# Patient Record
Sex: Male | Born: 1960 | Race: White | Hispanic: No | Marital: Married | Smoking: Never smoker
Health system: Southern US, Community
[De-identification: ages and names within clinical notes are randomized; demographics above are authoritative.]

## PROBLEM LIST (undated history)

## (undated) ENCOUNTER — Emergency Department (HOSPITAL_COMMUNITY): Admission: EM | Payer: Medicare Other | Source: Home / Self Care

## (undated) DIAGNOSIS — W3400XA Accidental discharge from unspecified firearms or gun, initial encounter: Secondary | ICD-10-CM

## (undated) DIAGNOSIS — H9190 Unspecified hearing loss, unspecified ear: Secondary | ICD-10-CM

## (undated) DIAGNOSIS — F419 Anxiety disorder, unspecified: Secondary | ICD-10-CM

## (undated) DIAGNOSIS — E119 Type 2 diabetes mellitus without complications: Secondary | ICD-10-CM

## (undated) DIAGNOSIS — F32A Depression, unspecified: Secondary | ICD-10-CM

## (undated) DIAGNOSIS — IMO0002 Reserved for concepts with insufficient information to code with codable children: Secondary | ICD-10-CM

## (undated) DIAGNOSIS — K219 Gastro-esophageal reflux disease without esophagitis: Secondary | ICD-10-CM

## (undated) DIAGNOSIS — M549 Dorsalgia, unspecified: Secondary | ICD-10-CM

## (undated) DIAGNOSIS — R198 Other specified symptoms and signs involving the digestive system and abdomen: Secondary | ICD-10-CM

## (undated) DIAGNOSIS — Y249XXA Unspecified firearm discharge, undetermined intent, initial encounter: Secondary | ICD-10-CM

## (undated) DIAGNOSIS — S8990XA Unspecified injury of unspecified lower leg, initial encounter: Secondary | ICD-10-CM

## (undated) DIAGNOSIS — I10 Essential (primary) hypertension: Secondary | ICD-10-CM

## (undated) DIAGNOSIS — S149XXA Injury of unspecified nerves of neck, initial encounter: Secondary | ICD-10-CM

## (undated) DIAGNOSIS — F329 Major depressive disorder, single episode, unspecified: Secondary | ICD-10-CM

## (undated) HISTORY — PX: NECK SURGERY: SHX720

## (undated) HISTORY — DX: Anxiety disorder, unspecified: F41.9

## (undated) HISTORY — DX: Major depressive disorder, single episode, unspecified: F32.9

## (undated) HISTORY — PX: LEG SURGERY: SHX1003

## (undated) HISTORY — DX: Unspecified injury of unspecified lower leg, initial encounter: S89.90XA

## (undated) HISTORY — DX: Other specified symptoms and signs involving the digestive system and abdomen: R19.8

## (undated) HISTORY — PX: TONSILLECTOMY: SUR1361

## (undated) HISTORY — DX: Depression, unspecified: F32.A

## (undated) HISTORY — DX: Injury of unspecified nerves of neck, initial encounter: S14.9XXA

## (undated) HISTORY — DX: Reserved for concepts with insufficient information to code with codable children: IMO0002

---

## 2000-08-29 ENCOUNTER — Encounter: Payer: Self-pay | Admitting: Urology

## 2000-08-29 ENCOUNTER — Ambulatory Visit (HOSPITAL_COMMUNITY): Admission: RE | Admit: 2000-08-29 | Discharge: 2000-08-29 | Payer: Self-pay | Admitting: Urology

## 2003-03-13 ENCOUNTER — Ambulatory Visit (HOSPITAL_COMMUNITY): Admission: RE | Admit: 2003-03-13 | Discharge: 2003-03-13 | Payer: Self-pay | Admitting: Orthopaedic Surgery

## 2003-03-13 ENCOUNTER — Encounter: Payer: Self-pay | Admitting: Orthopaedic Surgery

## 2003-04-24 ENCOUNTER — Ambulatory Visit (HOSPITAL_COMMUNITY): Admission: RE | Admit: 2003-04-24 | Discharge: 2003-04-24 | Payer: Self-pay | Admitting: Urology

## 2003-08-18 ENCOUNTER — Ambulatory Visit (HOSPITAL_COMMUNITY): Admission: RE | Admit: 2003-08-18 | Discharge: 2003-08-18 | Payer: Self-pay | Admitting: Orthopaedic Surgery

## 2004-02-05 ENCOUNTER — Ambulatory Visit (HOSPITAL_COMMUNITY): Admission: RE | Admit: 2004-02-05 | Discharge: 2004-02-05 | Payer: Self-pay | Admitting: Urology

## 2005-06-09 ENCOUNTER — Ambulatory Visit (HOSPITAL_COMMUNITY): Admission: RE | Admit: 2005-06-09 | Discharge: 2005-06-09 | Payer: Self-pay | Admitting: Orthopaedic Surgery

## 2006-09-03 ENCOUNTER — Ambulatory Visit (HOSPITAL_COMMUNITY): Admission: RE | Admit: 2006-09-03 | Discharge: 2006-09-03 | Payer: Self-pay | Admitting: Internal Medicine

## 2007-02-13 ENCOUNTER — Emergency Department (HOSPITAL_COMMUNITY): Admission: EM | Admit: 2007-02-13 | Discharge: 2007-02-13 | Payer: Self-pay | Admitting: Emergency Medicine

## 2007-02-25 ENCOUNTER — Ambulatory Visit (HOSPITAL_COMMUNITY): Admission: RE | Admit: 2007-02-25 | Discharge: 2007-02-25 | Payer: Self-pay | Admitting: Orthopaedic Surgery

## 2007-05-01 ENCOUNTER — Ambulatory Visit (HOSPITAL_COMMUNITY): Admission: RE | Admit: 2007-05-01 | Discharge: 2007-05-01 | Payer: Self-pay | Admitting: Orthopaedic Surgery

## 2007-10-28 ENCOUNTER — Ambulatory Visit (HOSPITAL_COMMUNITY): Admission: RE | Admit: 2007-10-28 | Discharge: 2007-10-29 | Payer: Self-pay | Admitting: Neurosurgery

## 2009-03-10 ENCOUNTER — Ambulatory Visit (HOSPITAL_COMMUNITY): Payer: Self-pay | Admitting: Psychology

## 2009-04-20 ENCOUNTER — Ambulatory Visit (HOSPITAL_COMMUNITY): Payer: Self-pay | Admitting: Psychology

## 2009-05-04 ENCOUNTER — Ambulatory Visit (HOSPITAL_COMMUNITY): Payer: Self-pay | Admitting: Psychology

## 2009-06-21 ENCOUNTER — Ambulatory Visit (HOSPITAL_COMMUNITY): Payer: Self-pay | Admitting: Psychology

## 2009-07-30 ENCOUNTER — Ambulatory Visit (HOSPITAL_COMMUNITY): Payer: Self-pay | Admitting: Psychology

## 2010-07-26 ENCOUNTER — Other Ambulatory Visit (HOSPITAL_COMMUNITY): Payer: Self-pay | Admitting: Internal Medicine

## 2010-07-29 ENCOUNTER — Ambulatory Visit (HOSPITAL_COMMUNITY)
Admission: RE | Admit: 2010-07-29 | Discharge: 2010-07-29 | Disposition: A | Discharge status: Home / Self Care | Payer: 59 | Source: Ambulatory Visit | Attending: Internal Medicine | Admitting: Internal Medicine

## 2010-07-29 DIAGNOSIS — R319 Hematuria, unspecified: Secondary | ICD-10-CM | POA: Insufficient documentation

## 2010-07-29 DIAGNOSIS — R109 Unspecified abdominal pain: Secondary | ICD-10-CM | POA: Insufficient documentation

## 2010-07-29 DIAGNOSIS — N200 Calculus of kidney: Secondary | ICD-10-CM | POA: Insufficient documentation

## 2010-07-29 MED ORDER — IOHEXOL 300 MG/ML  SOLN
125.0000 mL | Freq: Once | INTRAMUSCULAR | Status: AC | PRN
  Administered 2010-07-29: 125 mL via INTRAVENOUS

## 2010-11-01 NOTE — Op Note (Signed)
Nathan Sanchez, Nathan Sanchez NO.:  [id number]   MEDICAL RECORD NO.:  [id number]          PATIENT TYPE:  OIB   LOCATION:  3528                         FACILITY:  [facility]   PHYSICIAN:  Hewitt Shorts, M.D.DATE OF BIRTH:  1960-10-07   DATE OF PROCEDURE:  10/28/2007  DATE OF DISCHARGE:                               OPERATIVE REPORT   PREOPERATIVE DIAGNOSES:  1. Cervical disk herniation.  2. Cervical spondylosis.  3. Cervical degenerative disease.  4. Cervical radiculopathy.   POSTOPERATIVE DIAGNOSES:  1. Cervical disk herniation.  2. Cervical spondylosis.  3. Cervical degenerative disease.  4. Cervical radiculopathy.   PROCEDURE:  C5-C6 anterior cervical decompression, arthrodesis with  allograft and Tether cervical plating.   SURGEON:  Hewitt Shorts, MD   ASSISTANT:  Nelia Shi. Webb Silversmith, RN and Payton Doughty, MD   ANESTHESIA:  General endotracheal.   INDICATIONS:  This is a 50 year old man who presented with neck pain  extending to the shoulder and arms bilaterally, found to have  significant degeneration with broad based disk herniation at the C5-C6  level, was admitted, taken to surgery for decompression arthrodesis.   PROCEDURE:  The patient was brought to the operating room and placed  under general endotracheal anesthesia.  The patient was placed in 10  pounds of Holter traction.  The neck was prepped with Betadine soap and  solution and draped in sterile fashion.  A horizontal incision was made  in the left side of the neck.  The line of the incision was infiltrated  with local anesthetic with epinephrine.  Incision was made and carried  down through the subcutaneous tissue with bipolar electrocautery.  Electrocautery was used to maintain hemostasis.  Dissection was carried  down to the platysma which was divided and then we dissected through the  avascular plane leaving the sternocleidomastoid, carotid artery, and  jugular vein laterally, and the  trachea and esophagus medially.  The  ventral aspect of the vertebral column was identified.  An x-ray was  taken and the C5-C6 intervertebral disk space identified.  Diskectomy  was begun with incision of the annulus and continued with microcurettes  and pituitary rongeurs.  The disk itself was degenerated.  The  microscope was draped and brought into the field to provide additional  navigation, illumination, and visualization, and the remainder of the  decompression was performed using microdissection and microsurgical  technique.  The cartilaginous endplates of the vertebrae were removed  using the microcurettes and X-Max drill and then posterior osteophytic  overgrowth was removed using the X-Max drill along with a 2-mm Kerrison  punch with a thin foot plate.  The posterior longitudinal ligament was  carefully removed and we were able to decompress and remove the disk  herniation.  We were able to decompress the spinal canal and thecal sac.  Then our attention was turned to the neural foramina, which was  similarly decompressed by removing the protruding degenerative disk  material.  In the end, good decompression was achieved and once the  decompression was completed, hemostasis was established with the use of  Gelfoam soaked in thrombin, and then we measured the height of the  intervertebral disk spaces and selected a 9-mm interbody implant.  It  was hydrated in saline solution and then positioned in the  intervertebral disk space and countersunk.  We then selected a 14-mm  Tether cervical plate.  It was positioned over the fusion construct.  It  was secured to the vertebra with a pair of 4 x 16 mm variable-angle  screws at each level.  Each drill hole had a prior hole placed and the  screws then placed in the alternating fashion.  Once all 4 screws were  in place, a final tightening was performed.  An x-ray was taken which  showed the plate, grafts, and screws of C5 in good  position.  Screws of  C6 were poorly visualized because of his shoulders, but under direct  visualization construct appeared good.  The wound was irrigated with  Bacitracin solution and checked, hemostasis was established and  confirmed and then we proceeded with the closure.  The platysma was  closed with interrupted inverted 2-0 undyed Vicryl sutures.  The  subcutaneous and subcuticular were closed with interrupted inverted 3-0  undyed Vicryl sutures.  The skin was approximated with Dermabond.  The  procedure was tolerated well.  The estimated blood loss was 50 mL.  Sponge counts were correct.  Following surgery, the patient was placed  in a soft cervical collar, reversed from the anesthetic, extubated, and  transferred to the recovery room for further care.      Hewitt Shorts, M.D.  Electronically Signed     RWN/MEDQ  D:  10/28/2007  T:  10/29/2007  Job:  [id number]

## 2010-11-04 NOTE — H&P (Signed)
NAME:  Nathan Sanchez, Nathan Sanchez                           ACCOUNT NO.:  [id number]   MEDICAL RECORD NO.:  [id number]                   PATIENT TYPE:  AMB   LOCATION:  DAY                                  FACILITY:  [facility]   PHYSICIAN:  J. Darreld Mclean, M.D.              DATE OF BIRTH:  [birth year]   DATE OF ADMISSION:  08/18/2003  DATE OF DISCHARGE:                                HISTORY & PHYSICAL   CHIEF COMPLAINT:  Carpal tunnel syndrome of right hand.   HISTORY OF PRESENT ILLNESS:  The patient is a 50 year old white male who is  to be admitted through day surgery on August 18, 2003 to undergo a carpal  tunnel release of his right hand.   He was initially seen here in our office on July 31, 2003, complaining  of numbness in both hands, right greater than left.  He has a history of  nocturnal numbness and tingling, pain and numbness particularly to the  median nerve distribution of both hands, again, right more so than the left  hand.   It was felt by Dr. Teola Bradley that he had carpal tunnel syndrome.  He  was set up to undergo nerve conduction velocities; these were done on  August 03, 2003 and they showed that he has severe carpal tunnel syndrome  of his right hand and moderate carpal tunnel syndrome of the left hand.   The patient returns today.  Results of the study were explained to him and  other conservative measures as well as the cockup splint he is now wearing  were recommended and the patient stated he wanted to go ahead and have this  surgery and it has been bothering him for several years and he cannot rest  because it.  Risks and imponderables were discussed with him such as  decreased strength to the hand following the procedure.  He understands and  wants to go ahead with the procedure.   PAST MEDICAL HISTORY:  The patient has been in excellent health; he stated,  however, he had some GI upset secondary to the Vicodin that was prescribed  per Dr. Hilda Lias before  for the carpal tunnel.  He denies diabetes mellitus,  stroke, cardiac or respiratory problems.   MEDICATIONS:  Darvocet-N 100 one tab q.4 h. p.r.n. for pain.   ALLERGIES:  Questionable allergy to VICODIN.   LOCAL MEDICAL DOCTOR:  Dr. Kingsley Callander. [place].   FAMILY AND SOCIAL HISTORY:  The patient is married.  He works as a Psychologist, occupational at  [employer] in [place].  He does not smoke, nor does he  drink alcohol.  He has a Holiday representative.   FAMILY HISTORY:  The patient states his parents are in good health; it is  unclear as to whether his grandparents are or not.   REVIEW OF SYSTEMS:  Review of systems is negative for this problem.  PHYSICAL EXAMINATION:  GENERAL:  The patient is 6-foot 2-inches tall and  weighs 205 pounds, alert and oriented x3.  VITAL SIGNS:  He is afebrile, pulse of 56, respirations 12, blood pressure  118/82.  HEENT:  Within normal limits.  NECK:  Neck is supple, no thyromegaly or masses palpated.  LUNGS:  Lungs are clear to A&P.  HEART:  Regular rhythm.  No cardiomegaly is noted.  ABDOMEN:  Abdomen is flat, soft and nontender.  No organomegaly or masses  palpated.  Hypoactive bowel sounds are auscultated.  EXTREMITIES:  Right hand and left hand:  Positive Tinel's and Phalen's  signs.  It appears that he has lost some thenar muscle on the right as  compared to the left.  He has full range of motion in this fingers and  wrists.  Neurovascular is intact to both hands.  Other extremities within  normal limits and neurovascularly intact to them.  SKIN:  Skin is intact.  CNS:  Intact.   IMPRESSION:  Carpal tunnel syndrome, right greater than left hand.   PLAN:  The patient is to be admitted through day hospital on August 18, 2003  to undergo carpal tunnel release of the right hand.     _____________________________________  ___________________________________________  Candace Cruise, P.ATeola Bradley, M.D.   BB/MEDQ  D:   08/07/2003  T:  08/07/2003  Job:  [id number]

## 2010-11-04 NOTE — Op Note (Signed)
NAME:  QUILL, GRINDER                           ACCOUNT NO.:  [id number]   MEDICAL RECORD NO.:  [id number]                   PATIENT TYPE:  AMB   LOCATION:  DAY                                  FACILITY:  [facility]   PHYSICIAN:  J. Darreld Mclean, M.D.              DATE OF BIRTH:  [birth year]   DATE OF PROCEDURE:  DATE OF DISCHARGE:                                 OPERATIVE REPORT   PREOPERATIVE DIAGNOSIS:  Carpal tunnel syndrome, right.   POSTOPERATIVE DIAGNOSIS:  Carpal tunnel syndrome, right.   PROCEDURE:  Release of volar carpal ligament, saline neurolysis  aponeurotomy, right median nerve.   ANESTHESIA:  Bier block.   SURGEON:  J. Darreld Mclean, M.D.   ASSISTANT:  Candace Cruise, P.A.-C.   DRAINS:  None.  Volar plaster splint applied.   TOURNIQUET TIME:  Please see anesthesia record for tourniquet time.   INDICATIONS FOR PROCEDURE:  The patient is a 50 year old male with pain and  tenderness consistent with carpal tunnel syndrome.  Nerve conduction  velocity study showed carpal tunnel syndrome, severe on the right, moderate  on the left.  The patient has not improved with conservative treatment.  Surgery is now indicated.  The risks and imponderables have been discussed  with the patient who appears to understand and agreed to the procedure as  outlined.  He asked appropriate questions.   DESCRIPTION OF PROCEDURE:  The patient was seen in the holding area, and his  right hand was marked by both the patient and myself.   The patient was brought to the operating room and Bier block anesthesia was  given.  He was prepped and draped in the usual manner.  We reconfirmed that  we were doing the right hand for carpal tunnel surgery.  The outline for the  incision was made on the volar side of the hand.  With careful dissection,  the median nerve was identified and a vessel loop placed around the nerve.  Then a groove director was placed within the carpal tunnel space, and the  volar  carpal ligament was then incised.  The nerve was obviously compressed.  The retinaculum was cut proximally.  Saline neurolysis aponeurotomy carried  out.  The specimen of the volar carpal ligament was sent to pathology.  It  should be noted that in the wound, there looked like there was some foreign  body, and we removed three small metallic fragments up deep under the skin.  They were exposed when we opened the hand.  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  The area was inspected,  and there was no apparent injury.  The wound was reapproximated using 3-0  nylon in interrupted vertical mattress manner.  A sterile dressing was  applied.  Sheet cotton applied.  Sheet cotton cut dorsally.  Volar plaster  splint applied.  Ace bandage applied  loosely.   The patient tolerated the procedure well and went to recovery in good  condition.  The patient was given a prescription for Darvocet-N 100 for  pain.  He will be seen in the office in approximately 10 days to 2 weeks.  For any difficulties, he is to contact me through the office or hospital  beeper system.      ___________________________________________                                            Teola Bradley, M.D.   JWK/MEDQ  D:  08/18/2003  T:  08/18/2003  Job:  [id number]

## 2010-11-25 ENCOUNTER — Encounter (HOSPITAL_COMMUNITY): Payer: 59 | Admitting: Psychology

## 2011-01-10 ENCOUNTER — Encounter (INDEPENDENT_AMBULATORY_CARE_PROVIDER_SITE_OTHER): Payer: 59 | Admitting: Psychology

## 2011-01-10 DIAGNOSIS — F0630 Mood disorder due to known physiological condition, unspecified: Secondary | ICD-10-CM

## 2011-01-10 DIAGNOSIS — F4310 Post-traumatic stress disorder, unspecified: Secondary | ICD-10-CM

## 2011-01-24 ENCOUNTER — Encounter (HOSPITAL_COMMUNITY): Payer: 59 | Admitting: Psychology

## 2011-01-26 ENCOUNTER — Ambulatory Visit (HOSPITAL_COMMUNITY): Payer: 59 | Admitting: Psychology

## 2011-05-17 ENCOUNTER — Encounter (HOSPITAL_COMMUNITY): Payer: Self-pay | Admitting: Psychology

## 2011-05-17 ENCOUNTER — Ambulatory Visit (INDEPENDENT_AMBULATORY_CARE_PROVIDER_SITE_OTHER): Payer: 59 | Admitting: Psychology

## 2011-05-17 DIAGNOSIS — F0631 Mood disorder due to known physiological condition with depressive features: Secondary | ICD-10-CM

## 2011-05-17 DIAGNOSIS — F0630 Mood disorder due to known physiological condition, unspecified: Secondary | ICD-10-CM

## 2011-05-17 DIAGNOSIS — F4310 Post-traumatic stress disorder, unspecified: Secondary | ICD-10-CM
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
## 2011-06-07 ENCOUNTER — Ambulatory Visit (INDEPENDENT_AMBULATORY_CARE_PROVIDER_SITE_OTHER): Payer: 59 | Admitting: Psychology

## 2011-06-07 DIAGNOSIS — F4310 Post-traumatic stress disorder, unspecified: Secondary | ICD-10-CM

## 2011-06-07 DIAGNOSIS — F0630 Mood disorder due to known physiological condition, unspecified: Secondary | ICD-10-CM

## 2011-06-07 DIAGNOSIS — F0631 Mood disorder due to known physiological condition with depressive features: Secondary | ICD-10-CM
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
## 2011-09-13 ENCOUNTER — Ambulatory Visit (HOSPITAL_COMMUNITY): Payer: 59 | Admitting: Psychology

## 2011-10-19 ENCOUNTER — Ambulatory Visit (HOSPITAL_COMMUNITY): Payer: Self-pay | Admitting: Psychology

## 2011-12-29 ENCOUNTER — Emergency Department (HOSPITAL_COMMUNITY)
Admission: EM | Admit: 2011-12-29 | Discharge: 2011-12-29 | Disposition: A | Discharge status: Home / Self Care | Payer: 59 | Attending: Emergency Medicine | Admitting: Emergency Medicine

## 2011-12-29 ENCOUNTER — Encounter (HOSPITAL_COMMUNITY): Payer: Self-pay | Admitting: *Deleted

## 2011-12-29 DIAGNOSIS — F341 Dysthymic disorder: Secondary | ICD-10-CM | POA: Insufficient documentation

## 2011-12-29 DIAGNOSIS — B9789 Other viral agents as the cause of diseases classified elsewhere: Secondary | ICD-10-CM | POA: Insufficient documentation

## 2011-12-29 DIAGNOSIS — W57XXXA Bitten or stung by nonvenomous insect and other nonvenomous arthropods, initial encounter: Secondary | ICD-10-CM

## 2011-12-29 DIAGNOSIS — R112 Nausea with vomiting, unspecified: Secondary | ICD-10-CM | POA: Insufficient documentation

## 2011-12-29 DIAGNOSIS — B349 Viral infection, unspecified: Secondary | ICD-10-CM

## 2011-12-29 HISTORY — DX: Unspecified hearing loss, unspecified ear: H91.90

## 2011-12-29 MED ORDER — MORPHINE SULFATE 2 MG/ML IJ SOLN
2.0000 mg | Freq: Once | INTRAMUSCULAR | Status: AC
  Administered 2011-12-29: 2 mg via INTRAVENOUS

## 2011-12-29 MED ORDER — DOXYCYCLINE HYCLATE 100 MG IV SOLR
100.0000 mg | Freq: Once | INTRAVENOUS | Status: DC
  Filled 2011-12-29: qty 100

## 2011-12-29 MED ORDER — DOXYCYCLINE HYCLATE 100 MG PO TABS
ORAL_TABLET | ORAL | Status: AC
  Filled 2011-12-29: qty 1

## 2011-12-29 MED ORDER — MORPHINE SULFATE 4 MG/ML IJ SOLN
INTRAMUSCULAR | Status: AC
  Filled 2011-12-29: qty 1

## 2011-12-29 MED ORDER — DOXYCYCLINE HYCLATE 100 MG PO CAPS: 100.0000 mg | ORAL_CAPSULE | Freq: Two times a day (BID) | ORAL | Status: AC

## 2011-12-29 MED ORDER — ONDANSETRON HCL 4 MG/2ML IJ SOLN
4.0000 mg | Freq: Once | INTRAMUSCULAR | Status: AC
  Administered 2011-12-29: 4 mg via INTRAVENOUS
  Filled 2011-12-29: qty 2

## 2011-12-29 MED ORDER — KETOROLAC TROMETHAMINE 30 MG/ML IJ SOLN
30.0000 mg | Freq: Once | INTRAMUSCULAR | Status: AC
  Administered 2011-12-29: 30 mg via INTRAVENOUS
  Filled 2011-12-29: qty 1

## 2011-12-29 MED ORDER — SODIUM CHLORIDE 0.9 % IV BOLUS (SEPSIS)
1000.0000 mL | Freq: Once | INTRAVENOUS | Status: AC
  Administered 2011-12-29: 1000 mL via INTRAVENOUS

## 2011-12-29 MED ORDER — DOXYCYCLINE HYCLATE 100 MG PO TABS
100.0000 mg | ORAL_TABLET | Freq: Once | ORAL | Status: AC
  Administered 2011-12-29: 100 mg via ORAL

## 2011-12-29 MED ORDER — ONDANSETRON HCL 4 MG PO TABS: 4.0000 mg | ORAL_TABLET | Freq: Four times a day (QID) | ORAL | Status: AC

## 2011-12-29 NOTE — ED Notes (Signed)
Pt reports nausea & vomiting x1 day, now having abdominal pain & body aches. Pulled a tick off of his back a week ago.

## 2011-12-29 NOTE — ED Notes (Signed)
AC called for medication 

## 2011-12-29 NOTE — ED Notes (Signed)
Pt alert & oriented x4, stable gait. Patient given discharge instructions, paperwork & prescription(s). Patient  instructed to stop at the registration desk to finish any additional paperwork. Patient verbalized understanding. Pt left department w/ no further questions. 
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
## 2012-01-31 ENCOUNTER — Encounter (HOSPITAL_COMMUNITY): Payer: Self-pay | Admitting: Psychology

## 2012-01-31 ENCOUNTER — Ambulatory Visit (INDEPENDENT_AMBULATORY_CARE_PROVIDER_SITE_OTHER): Payer: 59 | Admitting: Psychology

## 2012-01-31 DIAGNOSIS — F0631 Mood disorder due to known physiological condition with depressive features: Secondary | ICD-10-CM

## 2012-01-31 DIAGNOSIS — F39 Unspecified mood [affective] disorder: Secondary | ICD-10-CM

## 2012-01-31 DIAGNOSIS — F4310 Post-traumatic stress disorder, unspecified: Secondary | ICD-10-CM
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
## 2012-03-01 ENCOUNTER — Ambulatory Visit (HOSPITAL_COMMUNITY): Payer: Self-pay | Admitting: Psychology

## 2012-03-05 ENCOUNTER — Ambulatory Visit (HOSPITAL_COMMUNITY): Payer: Self-pay | Admitting: Psychology

## 2012-03-07 ENCOUNTER — Ambulatory Visit (HOSPITAL_COMMUNITY): Payer: Self-pay | Admitting: Psychology

## 2012-05-12 ENCOUNTER — Emergency Department (HOSPITAL_COMMUNITY): Payer: 59

## 2012-05-12 ENCOUNTER — Encounter (HOSPITAL_COMMUNITY): Payer: Self-pay | Admitting: Emergency Medicine

## 2012-05-12 ENCOUNTER — Emergency Department (HOSPITAL_COMMUNITY)
Admission: EM | Admit: 2012-05-12 | Discharge: 2012-05-12 | Disposition: A | Discharge status: Home / Self Care | Payer: 59 | Attending: Emergency Medicine | Admitting: Emergency Medicine

## 2012-05-12 DIAGNOSIS — F329 Major depressive disorder, single episode, unspecified: Secondary | ICD-10-CM | POA: Insufficient documentation

## 2012-05-12 DIAGNOSIS — Z87828 Personal history of other (healed) physical injury and trauma: Secondary | ICD-10-CM | POA: Insufficient documentation

## 2012-05-12 DIAGNOSIS — H209 Unspecified iridocyclitis: Secondary | ICD-10-CM | POA: Insufficient documentation

## 2012-05-12 DIAGNOSIS — H53149 Visual discomfort, unspecified: Secondary | ICD-10-CM | POA: Insufficient documentation

## 2012-05-12 DIAGNOSIS — F411 Generalized anxiety disorder: Secondary | ICD-10-CM | POA: Insufficient documentation

## 2012-05-12 DIAGNOSIS — F3289 Other specified depressive episodes: Secondary | ICD-10-CM | POA: Insufficient documentation

## 2012-05-12 DIAGNOSIS — H5789 Other specified disorders of eye and adnexa: Secondary | ICD-10-CM | POA: Insufficient documentation

## 2012-05-12 MED ORDER — TETRACAINE HCL 0.5 % OP SOLN
OPHTHALMIC | Status: AC
  Administered 2012-05-12: 03:00:00
  Filled 2012-05-12: qty 2

## 2012-05-12 MED ORDER — HYDROCODONE-ACETAMINOPHEN 5-325 MG PO TABS: 1.0000 | ORAL_TABLET | ORAL | Status: DC | PRN

## 2012-05-12 MED ORDER — FLUORESCEIN SODIUM 1 MG OP STRP
ORAL_STRIP | OPHTHALMIC | Status: AC
  Administered 2012-05-12: 03:00:00
  Filled 2012-05-12: qty 1

## 2012-05-12 MED ORDER — ERYTHROMYCIN 5 MG/GM OP OINT: TOPICAL_OINTMENT | OPHTHALMIC | Status: DC

## 2012-05-12 NOTE — ED Notes (Signed)
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

## 2012-05-12 NOTE — ED Notes (Signed)
Patient transported to CT 
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
## 2012-07-20 ENCOUNTER — Emergency Department (HOSPITAL_COMMUNITY)
Admission: EM | Admit: 2012-07-20 | Discharge: 2012-07-20 | Disposition: A | Discharge status: Home / Self Care | Payer: 59 | Attending: Emergency Medicine | Admitting: Emergency Medicine

## 2012-07-20 ENCOUNTER — Emergency Department (HOSPITAL_COMMUNITY): Payer: 59

## 2012-07-20 ENCOUNTER — Encounter (HOSPITAL_COMMUNITY): Payer: Self-pay | Admitting: *Deleted

## 2012-07-20 DIAGNOSIS — Z79899 Other long term (current) drug therapy: Secondary | ICD-10-CM | POA: Insufficient documentation

## 2012-07-20 DIAGNOSIS — Z8719 Personal history of other diseases of the digestive system: Secondary | ICD-10-CM | POA: Insufficient documentation

## 2012-07-20 DIAGNOSIS — X503XXA Overexertion from repetitive movements, initial encounter: Secondary | ICD-10-CM | POA: Insufficient documentation

## 2012-07-20 DIAGNOSIS — R6889 Other general symptoms and signs: Secondary | ICD-10-CM | POA: Insufficient documentation

## 2012-07-20 DIAGNOSIS — Z8659 Personal history of other mental and behavioral disorders: Secondary | ICD-10-CM | POA: Insufficient documentation

## 2012-07-20 DIAGNOSIS — Y939 Activity, unspecified: Secondary | ICD-10-CM | POA: Insufficient documentation

## 2012-07-20 DIAGNOSIS — S298XXA Other specified injuries of thorax, initial encounter: Secondary | ICD-10-CM | POA: Insufficient documentation

## 2012-07-20 DIAGNOSIS — R0789 Other chest pain: Secondary | ICD-10-CM

## 2012-07-20 DIAGNOSIS — Y929 Unspecified place or not applicable: Secondary | ICD-10-CM | POA: Insufficient documentation

## 2012-07-20 DIAGNOSIS — Z87828 Personal history of other (healed) physical injury and trauma: Secondary | ICD-10-CM | POA: Insufficient documentation

## 2012-07-20 MED ORDER — HYDROCODONE-ACETAMINOPHEN 5-325 MG PO TABS: 1.0000 | ORAL_TABLET | ORAL | Status: DC | PRN

## 2012-07-20 MED ORDER — IBUPROFEN 800 MG PO TABS: 800.0000 mg | ORAL_TABLET | Freq: Three times a day (TID) | ORAL | Status: DC

## 2012-07-20 MED ORDER — HYDROCODONE-ACETAMINOPHEN 5-325 MG PO TABS
2.0000 | ORAL_TABLET | Freq: Once | ORAL | Status: AC
  Administered 2012-07-20: 2 via ORAL
  Filled 2012-07-20: qty 2

## 2012-07-20 MED ORDER — ONDANSETRON HCL 4 MG PO TABS
4.0000 mg | ORAL_TABLET | Freq: Once | ORAL | Status: AC
  Administered 2012-07-20: 4 mg via ORAL
  Filled 2012-07-20: qty 1

## 2012-07-20 MED ORDER — IBUPROFEN 800 MG PO TABS
800.0000 mg | ORAL_TABLET | Freq: Once | ORAL | Status: AC
  Administered 2012-07-20: 800 mg via ORAL
  Filled 2012-07-20: qty 1

## 2012-07-20 NOTE — ED Notes (Signed)
Pt states he sneezed and felt something pop in his right rib cage area.

## 2012-07-20 NOTE — ED Notes (Signed)
Pt states he sneezed today and felt a pop in his right rib area, states pain with a deep breath. Denies having a cough or any difficulty breathing, lungs sound equal, clear at this time.
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
## 2012-07-23 NOTE — ED Provider Notes (Signed)
Medical screening examination/treatment/procedure(s) were performed by non-physician practitioner and as supervising physician I was immediately available for consultation/collaboration.   Shelda Jakes, MD 07/23/12 [phone]

## 2012-09-16 ENCOUNTER — Emergency Department (HOSPITAL_COMMUNITY): Payer: 59

## 2012-09-16 ENCOUNTER — Emergency Department (HOSPITAL_COMMUNITY)
Admission: EM | Admit: 2012-09-16 | Discharge: 2012-09-16 | Disposition: A | Discharge status: Home / Self Care | Payer: 59 | Attending: Emergency Medicine | Admitting: Emergency Medicine

## 2012-09-16 ENCOUNTER — Encounter (HOSPITAL_COMMUNITY): Payer: Self-pay | Admitting: *Deleted

## 2012-09-16 DIAGNOSIS — Y9301 Activity, walking, marching and hiking: Secondary | ICD-10-CM | POA: Insufficient documentation

## 2012-09-16 DIAGNOSIS — Z87828 Personal history of other (healed) physical injury and trauma: Secondary | ICD-10-CM | POA: Insufficient documentation

## 2012-09-16 DIAGNOSIS — S90414A Abrasion, right lesser toe(s), initial encounter: Secondary | ICD-10-CM

## 2012-09-16 DIAGNOSIS — Z8719 Personal history of other diseases of the digestive system: Secondary | ICD-10-CM | POA: Insufficient documentation

## 2012-09-16 DIAGNOSIS — K029 Dental caries, unspecified: Secondary | ICD-10-CM | POA: Insufficient documentation

## 2012-09-16 DIAGNOSIS — Y929 Unspecified place or not applicable: Secondary | ICD-10-CM | POA: Insufficient documentation

## 2012-09-16 DIAGNOSIS — S9030XA Contusion of unspecified foot, initial encounter: Secondary | ICD-10-CM | POA: Insufficient documentation

## 2012-09-16 DIAGNOSIS — S9031XA Contusion of right foot, initial encounter: Secondary | ICD-10-CM

## 2012-09-16 DIAGNOSIS — IMO0002 Reserved for concepts with insufficient information to code with codable children: Secondary | ICD-10-CM | POA: Insufficient documentation

## 2012-09-16 DIAGNOSIS — Z8659 Personal history of other mental and behavioral disorders: Secondary | ICD-10-CM | POA: Insufficient documentation

## 2012-09-16 DIAGNOSIS — R269 Unspecified abnormalities of gait and mobility: Secondary | ICD-10-CM | POA: Insufficient documentation

## 2012-09-16 MED ORDER — HYDROCODONE-ACETAMINOPHEN 5-325 MG PO TABS
1.0000 | ORAL_TABLET | Freq: Once | ORAL | Status: AC
  Administered 2012-09-16: 1 via ORAL
  Filled 2012-09-16: qty 1

## 2012-09-16 MED ORDER — HYDROCODONE-ACETAMINOPHEN 5-325 MG PO TABS: ORAL_TABLET | ORAL | Status: DC

## 2012-09-16 MED ORDER — IBUPROFEN 800 MG PO TABS: 800.0000 mg | ORAL_TABLET | Freq: Three times a day (TID) | ORAL | Status: DC

## 2012-09-16 MED ORDER — IBUPROFEN 800 MG PO TABS
800.0000 mg | ORAL_TABLET | Freq: Once | ORAL | Status: AC
  Administered 2012-09-16: 800 mg via ORAL
  Filled 2012-09-16: qty 1

## 2012-09-16 MED ORDER — BACITRACIN ZINC 500 UNIT/GM EX OINT
TOPICAL_OINTMENT | CUTANEOUS | Status: AC
  Administered 2012-09-16: 1
  Filled 2012-09-16: qty 0.9
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
## 2012-09-16 NOTE — ED Notes (Signed)
Pain rt foot after kicking  A desk

## 2012-09-18 NOTE — ED Provider Notes (Signed)
Medical screening examination/treatment/procedure(s) were performed by non-physician practitioner and as supervising physician I was immediately available for consultation/collaboration.   Kenji Mapel M Casady Voshell, DO 09/18/12 1654 

## 2012-10-16 ENCOUNTER — Encounter (HOSPITAL_COMMUNITY): Payer: Self-pay | Admitting: *Deleted

## 2012-10-16 ENCOUNTER — Inpatient Hospital Stay (HOSPITAL_COMMUNITY)
Admission: EM | Admit: 2012-10-16 | Discharge: 2012-10-19 | DRG: 572 | Disposition: A | Discharge status: Home / Self Care | Payer: 59 | Attending: Emergency Medicine | Admitting: Emergency Medicine

## 2012-10-16 ENCOUNTER — Encounter (HOSPITAL_COMMUNITY): Admission: EM | Disposition: A | Discharge status: Home / Self Care | Payer: Self-pay | Source: Home / Self Care

## 2012-10-16 ENCOUNTER — Emergency Department (HOSPITAL_COMMUNITY): Payer: 59

## 2012-10-16 ENCOUNTER — Emergency Department (HOSPITAL_COMMUNITY): Payer: 59 | Admitting: Anesthesiology

## 2012-10-16 ENCOUNTER — Encounter (HOSPITAL_COMMUNITY): Payer: Self-pay | Admitting: Anesthesiology

## 2012-10-16 DIAGNOSIS — Z79899 Other long term (current) drug therapy: Secondary | ICD-10-CM

## 2012-10-16 DIAGNOSIS — A4902 Methicillin resistant Staphylococcus aureus infection, unspecified site: Secondary | ICD-10-CM | POA: Diagnosis present

## 2012-10-16 DIAGNOSIS — F3289 Other specified depressive episodes: Secondary | ICD-10-CM | POA: Diagnosis present

## 2012-10-16 DIAGNOSIS — L0390 Cellulitis, unspecified: Secondary | ICD-10-CM

## 2012-10-16 DIAGNOSIS — F411 Generalized anxiety disorder: Secondary | ICD-10-CM | POA: Diagnosis present

## 2012-10-16 DIAGNOSIS — F329 Major depressive disorder, single episode, unspecified: Secondary | ICD-10-CM | POA: Diagnosis present

## 2012-10-16 DIAGNOSIS — H9190 Unspecified hearing loss, unspecified ear: Secondary | ICD-10-CM | POA: Diagnosis present

## 2012-10-16 DIAGNOSIS — L03019 Cellulitis of unspecified finger: Secondary | ICD-10-CM | POA: Diagnosis present

## 2012-10-16 DIAGNOSIS — L02519 Cutaneous abscess of unspecified hand: Principal | ICD-10-CM | POA: Diagnosis present

## 2012-10-16 HISTORY — PX: I & D EXTREMITY: SHX5045

## 2012-10-16 HISTORY — PX: INCISION AND DRAINAGE: SHX5863

## 2012-10-16 LAB — CBC WITH DIFFERENTIAL/PLATELET
Basophils Relative: 0 % (ref 0–1)
Eosinophils Absolute: 0.1 10*3/uL (ref 0.0–0.7)
Hemoglobin: 14.2 g/dL (ref 13.0–17.0)
MCH: 31.3 pg (ref 26.0–34.0)
MCHC: 36.1 g/dL — ABNORMAL HIGH (ref 30.0–36.0)
Monocytes Absolute: 0.7 10*3/uL (ref 0.1–1.0)
Monocytes Relative: 6 % (ref 3–12)
Neutrophils Relative %: 82 % — ABNORMAL HIGH (ref 43–77)
RDW: 13.5 % (ref 11.5–15.5)

## 2012-10-16 LAB — BASIC METABOLIC PANEL
BUN: 7 mg/dL (ref 6–23)
Creatinine, Ser: 1.05 mg/dL (ref 0.50–1.35)
GFR calc Af Amer: >90 mL/min (ref >90)
GFR calc non Af Amer: 80 mL/min — ABNORMAL LOW (ref >90)
Potassium: 4.3 mEq/L (ref 3.5–5.1)

## 2012-10-16 SURGERY — IRRIGATION AND DEBRIDEMENT EXTREMITY
Anesthesia: General | Site: Finger | Laterality: Right | Wound class: Dirty or Infected

## 2012-10-16 MED ORDER — LIDOCAINE HCL (CARDIAC) 20 MG/ML IV SOLN
INTRAVENOUS | Status: DC | PRN
  Administered 2012-10-16: 80 mg via INTRAVENOUS

## 2012-10-16 MED ORDER — OXYCODONE HCL 5 MG/5ML PO SOLN: 5.0000 mg | Freq: Once | ORAL | Status: DC | PRN

## 2012-10-16 MED ORDER — VITAMIN C 500 MG PO TABS
1000.0000 mg | ORAL_TABLET | Freq: Every day | ORAL | Status: DC
  Administered 2012-10-17 – 2012-10-19 (×2): 1000 mg via ORAL
  Filled 2012-10-16 (×3): qty 2

## 2012-10-16 MED ORDER — ONDANSETRON HCL 4 MG PO TABS: 4.0000 mg | ORAL_TABLET | Freq: Four times a day (QID) | ORAL | Status: DC | PRN

## 2012-10-16 MED ORDER — VANCOMYCIN HCL IN DEXTROSE 1-5 GM/200ML-% IV SOLN
1000.0000 mg | Freq: Two times a day (BID) | INTRAVENOUS | Status: DC
  Administered 2012-10-17 – 2012-10-19 (×6): 1000 mg via INTRAVENOUS
  Filled 2012-10-16 (×6): qty 200

## 2012-10-16 MED ORDER — ALPRAZOLAM 0.5 MG PO TABS: 0.5000 mg | ORAL_TABLET | Freq: Four times a day (QID) | ORAL | Status: DC | PRN

## 2012-10-16 MED ORDER — HYDROMORPHONE HCL PF 1 MG/ML IJ SOLN: 0.2500 mg | INTRAMUSCULAR | Status: DC | PRN

## 2012-10-16 MED ORDER — HYDROCODONE-ACETAMINOPHEN 5-325 MG PO TABS
1.0000 | ORAL_TABLET | ORAL | Status: DC | PRN
  Administered 2012-10-16 – 2012-10-19 (×3): 2 via ORAL
  Filled 2012-10-16 (×4): qty 2

## 2012-10-16 MED ORDER — HYDROMORPHONE HCL PF 1 MG/ML IJ SOLN
0.5000 mg | INTRAMUSCULAR | Status: DC | PRN
  Administered 2012-10-16 – 2012-10-19 (×6): 1 mg via INTRAVENOUS
  Filled 2012-10-16 (×6): qty 1

## 2012-10-16 MED ORDER — SODIUM CHLORIDE 0.9 % IR SOLN
Status: DC | PRN
  Administered 2012-10-16: 3000 mL

## 2012-10-16 MED ORDER — PROMETHAZINE HCL 25 MG/ML IJ SOLN
6.2500 mg | INTRAMUSCULAR | Status: DC | PRN
  Filled 2012-10-16: qty 1

## 2012-10-16 MED ORDER — LACTATED RINGERS IV SOLN
INTRAVENOUS | Status: DC | PRN
  Administered 2012-10-16: 17:00:00 via INTRAVENOUS

## 2012-10-16 MED ORDER — MIDAZOLAM HCL 2 MG/2ML IJ SOLN: 0.5000 mg | Freq: Once | INTRAMUSCULAR | Status: DC | PRN

## 2012-10-16 MED ORDER — METHOCARBAMOL 500 MG PO TABS
500.0000 mg | ORAL_TABLET | Freq: Four times a day (QID) | ORAL | Status: DC | PRN
  Administered 2012-10-19 (×2): 500 mg via ORAL
  Filled 2012-10-16 (×3): qty 1

## 2012-10-16 MED ORDER — FENTANYL CITRATE 0.05 MG/ML IJ SOLN
INTRAMUSCULAR | Status: DC | PRN
  Administered 2012-10-16: 100 ug via INTRAVENOUS

## 2012-10-16 MED ORDER — HYDROMORPHONE HCL PF 1 MG/ML IJ SOLN
INTRAMUSCULAR | Status: AC
  Administered 2012-10-16: 0.5 mg via INTRAVENOUS
  Filled 2012-10-16: qty 1

## 2012-10-16 MED ORDER — KCL IN DEXTROSE-NACL 20-5-0.45 MEQ/L-%-% IV SOLN
INTRAVENOUS | Status: DC
  Administered 2012-10-16: 50 mL/h via INTRAVENOUS
  Administered 2012-10-17 – 2012-10-19 (×2): via INTRAVENOUS
  Filled 2012-10-16 (×4): qty 1000

## 2012-10-16 MED ORDER — OXYCODONE HCL 5 MG PO TABS
5.0000 mg | ORAL_TABLET | Freq: Once | ORAL | Status: DC | PRN
Start: 2012-10-16 — End: 2012-10-16

## 2012-10-16 MED ORDER — VANCOMYCIN HCL 10 G IV SOLR
1250.0000 mg | Freq: Once | INTRAVENOUS | Status: AC
  Administered 2012-10-16: 1250 mg via INTRAVENOUS
  Filled 2012-10-16: qty 1250

## 2012-10-16 MED ORDER — ONDANSETRON HCL 4 MG/2ML IJ SOLN
INTRAMUSCULAR | Status: DC | PRN
  Administered 2012-10-16: 4 mg via INTRAVENOUS

## 2012-10-16 MED ORDER — CLONAZEPAM 1 MG PO TABS: 1.0000 mg | ORAL_TABLET | Freq: Every evening | ORAL | Status: DC | PRN

## 2012-10-16 MED ORDER — ONDANSETRON HCL 4 MG/2ML IJ SOLN
4.0000 mg | Freq: Once | INTRAMUSCULAR | Status: AC
  Administered 2012-10-16: 4 mg via INTRAVENOUS
  Filled 2012-10-16: qty 2

## 2012-10-16 MED ORDER — METHOCARBAMOL 100 MG/ML IJ SOLN
500.0000 mg | Freq: Four times a day (QID) | INTRAVENOUS | Status: DC | PRN
  Filled 2012-10-16: qty 5

## 2012-10-16 MED ORDER — OXYCODONE-ACETAMINOPHEN 5-325 MG PO TABS
1.0000 | ORAL_TABLET | ORAL | Status: DC | PRN
  Administered 2012-10-17 – 2012-10-19 (×10): 2 via ORAL
  Filled 2012-10-16 (×10): qty 2

## 2012-10-16 MED ORDER — MORPHINE SULFATE 4 MG/ML IJ SOLN
4.0000 mg | Freq: Once | INTRAMUSCULAR | Status: AC
  Administered 2012-10-16: 4 mg via INTRAVENOUS
  Filled 2012-10-16: qty 1

## 2012-10-16 MED ORDER — PROPOFOL 10 MG/ML IV BOLUS
INTRAVENOUS | Status: DC | PRN
  Administered 2012-10-16: 200 mg via INTRAVENOUS

## 2012-10-16 MED ORDER — MEPERIDINE HCL 25 MG/ML IJ SOLN: 6.2500 mg | INTRAMUSCULAR | Status: DC | PRN

## 2012-10-16 MED ORDER — DIPHENHYDRAMINE HCL 25 MG PO CAPS: 25.0000 mg | ORAL_CAPSULE | Freq: Four times a day (QID) | ORAL | Status: DC | PRN

## 2012-10-16 MED ORDER — BUPIVACAINE HCL (PF) 0.25 % IJ SOLN: INTRAMUSCULAR | Status: DC | PRN

## 2012-10-16 MED ORDER — MIDAZOLAM HCL 5 MG/5ML IJ SOLN
INTRAMUSCULAR | Status: DC | PRN
  Administered 2012-10-16: 2 mg via INTRAVENOUS

## 2012-10-16 MED ORDER — ONDANSETRON HCL 4 MG/2ML IJ SOLN: 4.0000 mg | Freq: Four times a day (QID) | INTRAMUSCULAR | Status: DC | PRN

## 2012-10-16 MED ORDER — DOCUSATE SODIUM 100 MG PO CAPS
100.0000 mg | ORAL_CAPSULE | Freq: Two times a day (BID) | ORAL | Status: DC
  Administered 2012-10-17 – 2012-10-19 (×5): 100 mg via ORAL
  Filled 2012-10-16 (×8): qty 1

## 2012-10-16 MED ORDER — ADULT MULTIVITAMIN W/MINERALS CH
1.0000 | ORAL_TABLET | Freq: Every day | ORAL | Status: DC
  Administered 2012-10-17 – 2012-10-19 (×2): 1 via ORAL
  Filled 2012-10-16 (×3): qty 1

## 2012-10-16 SURGICAL SUPPLY — 61 items
BANDAGE CONFORM 2  STR LF (GAUZE/BANDAGES/DRESSINGS) ×1 IMPLANT
BANDAGE ELASTIC 3 VELCRO ST LF (GAUZE/BANDAGES/DRESSINGS) ×2 IMPLANT
BANDAGE ELASTIC 4 VELCRO ST LF (GAUZE/BANDAGES/DRESSINGS) ×2 IMPLANT
BANDAGE GAUZE ELAST BULKY 4 IN (GAUZE/BANDAGES/DRESSINGS) ×2 IMPLANT
BNDG CMPR 9X4 STRL LF SNTH (GAUZE/BANDAGES/DRESSINGS) ×1
BNDG CMPR MD 5X2 ELC HKLP STRL (GAUZE/BANDAGES/DRESSINGS) ×1
BNDG COHESIVE 1X5 TAN STRL LF (GAUZE/BANDAGES/DRESSINGS) IMPLANT
BNDG ELASTIC 2 VLCR STRL LF (GAUZE/BANDAGES/DRESSINGS) ×1 IMPLANT
BNDG ESMARK 4X9 LF (GAUZE/BANDAGES/DRESSINGS) ×2 IMPLANT
CLOTH BEACON ORANGE TIMEOUT ST (SAFETY) ×2 IMPLANT
CORDS BIPOLAR (ELECTRODE) ×2 IMPLANT
COVER SURGICAL LIGHT HANDLE (MISCELLANEOUS) ×2 IMPLANT
CUFF TOURNIQUET SINGLE 18IN (TOURNIQUET CUFF) ×2 IMPLANT
CUFF TOURNIQUET SINGLE 24IN (TOURNIQUET CUFF) IMPLANT
DRAIN PENROSE 1/4X12 LTX STRL (WOUND CARE) IMPLANT
DRAPE SURG 17X23 STRL (DRAPES) ×2 IMPLANT
DRSG ADAPTIC 3X8 NADH LF (GAUZE/BANDAGES/DRESSINGS) ×2 IMPLANT
ELECT REM PT RETURN 9FT ADLT (ELECTROSURGICAL)
ELECTRODE REM PT RTRN 9FT ADLT (ELECTROSURGICAL) IMPLANT
GAUZE XEROFORM 1X8 LF (GAUZE/BANDAGES/DRESSINGS) ×2 IMPLANT
GAUZE XEROFORM 5X9 LF (GAUZE/BANDAGES/DRESSINGS) IMPLANT
GLOVE BIO SURGEON STRL SZ7 (GLOVE) ×1 IMPLANT
GLOVE BIOGEL PI IND STRL 7.0 (GLOVE) IMPLANT
GLOVE BIOGEL PI IND STRL 8.5 (GLOVE) ×1 IMPLANT
GLOVE BIOGEL PI INDICATOR 7.0 (GLOVE) ×1
GLOVE BIOGEL PI INDICATOR 8.5 (GLOVE) ×1
GLOVE SURG ORTHO 8.0 STRL STRW (GLOVE) ×2 IMPLANT
GOWN PREVENTION PLUS XLARGE (GOWN DISPOSABLE) ×2 IMPLANT
GOWN STRL NON-REIN LRG LVL3 (GOWN DISPOSABLE) ×6 IMPLANT
HANDPIECE INTERPULSE COAX TIP (DISPOSABLE)
KIT BASIN OR (CUSTOM PROCEDURE TRAY) ×2 IMPLANT
KIT ROOM TURNOVER OR (KITS) ×2 IMPLANT
MANIFOLD NEPTUNE II (INSTRUMENTS) ×2 IMPLANT
NDL HYPO 25GX1X1/2 BEV (NEEDLE) IMPLANT
NEEDLE HYPO 25GX1X1/2 BEV (NEEDLE) IMPLANT
NS IRRIG 1000ML POUR BTL (IV SOLUTION) ×2 IMPLANT
PACK ORTHO EXTREMITY (CUSTOM PROCEDURE TRAY) ×2 IMPLANT
PAD ARMBOARD 7.5X6 YLW CONV (MISCELLANEOUS) ×4 IMPLANT
PAD CAST 3X4 CTTN HI CHSV (CAST SUPPLIES) IMPLANT
PAD CAST 4YDX4 CTTN HI CHSV (CAST SUPPLIES) ×1 IMPLANT
PADDING CAST COTTON 3X4 STRL (CAST SUPPLIES) ×2
PADDING CAST COTTON 4X4 STRL (CAST SUPPLIES) ×2
PADDING UNDERCAST 2  STERILE (CAST SUPPLIES) ×1 IMPLANT
SET HNDPC FAN SPRY TIP SCT (DISPOSABLE) IMPLANT
SOAP 2 % CHG 4 OZ (WOUND CARE) ×2 IMPLANT
SPONGE GAUZE 4X4 12PLY (GAUZE/BANDAGES/DRESSINGS) ×2 IMPLANT
SPONGE LAP 18X18 X RAY DECT (DISPOSABLE) ×2 IMPLANT
SPONGE LAP 4X18 X RAY DECT (DISPOSABLE) ×2 IMPLANT
SUCTION FRAZIER TIP 10 FR DISP (SUCTIONS) ×2 IMPLANT
SUT ETHILON 4 0 PS 2 18 (SUTURE) IMPLANT
SUT ETHILON 5 0 P 3 18 (SUTURE) ×1
SUT NYLON ETHILON 5-0 P-3 1X18 (SUTURE) ×1 IMPLANT
SYR CONTROL 10ML LL (SYRINGE) IMPLANT
TOWEL OR 17X24 6PK STRL BLUE (TOWEL DISPOSABLE) ×2 IMPLANT
TOWEL OR 17X26 10 PK STRL BLUE (TOWEL DISPOSABLE) ×2 IMPLANT
TUBE ANAEROBIC SPECIMEN COL (MISCELLANEOUS) IMPLANT
TUBE CONNECTING 12X1/4 (SUCTIONS) ×2 IMPLANT
TUBING CYSTO DISP (UROLOGICAL SUPPLIES) ×1 IMPLANT
UNDERPAD 30X30 INCONTINENT (UNDERPADS AND DIAPERS) ×2 IMPLANT
WATER STERILE IRR 1000ML POUR (IV SOLUTION) ×2 IMPLANT
YANKAUER SUCT BULB TIP NO VENT (SUCTIONS) ×2 IMPLANT

## 2012-10-16 NOTE — Transfer of Care (Signed)
Immediate Anesthesia Transfer of Care Note  Patient: Nathan Sanchez  Procedure(s) Performed: Procedure(s): IRRIGATION AND DEBRIDEMENT EXTREMITY (Right)  Patient Location: PACU  Anesthesia Type:General  Level of Consciousness: awake, alert , oriented and patient cooperative  Airway & Oxygen Therapy: Patient Spontanous Breathing  Post-op Assessment: Report given to PACU RN, Post -op Vital signs reviewed and stable and Patient moving all extremities X 4  Post vital signs: Reviewed and stable  Complications: No apparent anesthesia complications

## 2012-10-16 NOTE — ED Notes (Signed)
Pt had note from Dr Ouida Sills that said to call Dr Melvyn Novas on arrival to ED. Dr Melvyn Novas was paged, wants pt to be evaluated in ED and paged by MD if he is needed.

## 2012-10-16 NOTE — ED Notes (Signed)
Pt is here saw bump on right middle finger and then swelling increased on Saturday.  Saw MD on Monday and was started on abx and now here with swelling and redness to right hand and red lines extending up to shoulder.  Pulse present and patient is to see Dr. Melvyn Novas in the ED
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
## 2012-10-16 NOTE — Anesthesia Postprocedure Evaluation (Signed)
  Anesthesia Post-op Note  Patient: Nathan Sanchez  Procedure(s) Performed: Procedure(s): IRRIGATION AND DEBRIDEMENT EXTREMITY (Right)  Patient Location: PACU  Anesthesia Type:General  Level of Consciousness: awake  Airway and Oxygen Therapy: Patient Spontanous Breathing  Post-op Pain: mild  Post-op Assessment: Post-op Vital signs reviewed  Post-op Vital Signs: stable  Complications: No apparent anesthesia complications

## 2012-10-16 NOTE — Brief Op Note (Signed)
10/16/2012  4:39 PM  PATIENT:  Nathan Sanchez  52 y.o. male  PRE-OPERATIVE DIAGNOSIS:  Right Hand Infection  POST-OPERATIVE DIAGNOSIS:  * No post-op diagnosis entered *  PROCEDURE:  Procedure(s): IRRIGATION AND DEBRIDEMENT EXTREMITY (Right)  SURGEON:  Surgeon(s) and Role:    * Sharma Covert, MD - Primary  PHYSICIAN ASSISTANT:   ASSISTANTS: none   ANESTHESIA:   general  EBL:     BLOOD ADMINISTERED:none  DRAINS: none   LOCAL MEDICATIONS USED:  NONE  SPECIMEN:  No Specimen  DISPOSITION OF SPECIMEN:  N/A  COUNTS:  YES  TOURNIQUET:  * No tourniquets in log *  DICTATION: [id number]  PLAN OF CARE: Discharge to home after PACU  PATIENT DISPOSITION:  PACU - hemodynamically stable.   Delay start of Pharmacological VTE agent (>24hrs) due to surgical blood loss or risk of bleeding: not applicable

## 2012-10-16 NOTE — ED Notes (Signed)
Dr. Melvyn Novas at bedside with patient

## 2012-10-16 NOTE — Preoperative (Signed)
Beta Blockers   Reason not to administer Beta Blockers:Not Applicable 

## 2012-10-17 ENCOUNTER — Encounter (HOSPITAL_COMMUNITY): Payer: Self-pay | Admitting: General Practice

## 2012-10-17 MED ORDER — KCL IN DEXTROSE-NACL 20-5-0.45 MEQ/L-%-% IV SOLN: INTRAVENOUS | Status: DC

## 2012-10-17 MED ORDER — CHLORHEXIDINE GLUCONATE 4 % EX LIQD
60.0000 mL | Freq: Once | CUTANEOUS | Status: DC
  Filled 2012-10-17: qty 60

## 2012-10-17 NOTE — Op Note (Signed)
NAMECRUZ, Nathan Sanchez Nathan Sanchez  MEDICAL RECORD NO.:  [id number]  LOCATION:  [id number]                        FACILITY:  [facility]  PHYSICIAN:  Nathan Done, MD  DATE OF BIRTH:  [birth year]  DATE OF PROCEDURE:  10/16/2012 DATE OF DISCHARGE:                              OPERATIVE REPORT   PREOPERATIVE DIAGNOSIS:  Right long finger infection extending to the dorsum of the hand.  POSTOPERATIVE DIAGNOSIS:  Right long finger infection extending to the dorsum of the hand.  ATTENDING PHYSICIAN:  Nathan Done, MD, who scrubbed and present for the entire procedure.  ASSISTANT SURGEON:  None.  ANESTHESIA:  General via LMA.  SURGICAL PROCEDURE: 1. Right long finger extensor tenosynovectomy, long finger. 2. Right hand incision and drainage of dorsal abscess with excisional     debridement of the necrotic subcutaneous tissue.  SURGICAL INDICATIONS:  [name] is a 52 year old gentleman who presented to the emergency department with a grossly infected long finger extending into the dorsum of the hand.  The patient was seen and evaluated in the ER and recommended he undergo the above procedure. Risks, benefits, and alternatives were discussed in detail with the patient and signed informed consent was obtained.  Risks, benefits, risks included, but not limited to bleeding, infection, persistent infection and stiffness in the finger and need for further surgical intervention.  DESCRIPTION OF PROCEDURE:  The patient was properly identified in the preop holding area, and mark made along the right long finger to indicate correct operative site.  The patient was then brought back to the operating room, placed supine on anesthesia room table.  General anesthesia was administered.  The patient tolerated this well.  Right upper extremity was then prepped and draped in normal sterile fashion. Time-out was called.  Correct site was identified, and procedure then begun.   Attention was then turned to the right long finger where a longitudinal incision was made directly over the dorsal aspect of the long finger extending across the finger extending to the dorsum of the hand.  Dissection was carried down through the skin and subcutaneous tissue.  The patient had a grossly purulent infection.  Excisional debridement was then carried out of the subcutaneous tissue with a rongeur and knife and small curettes.  Tenosynovectomy was then carried out along the extensor tendon of the dorsal hand extending into the finger.  After extensive tenosynovectomy and wound debridement, the wound was then thoroughly irrigated.  It closed loosely over a small vessel loop as a drain.  Adaptic dressing was then applied.  A sterile compressive bandage was then applied.  The patient tolerated the procedure well, returned to recovery room in good condition.  POSTPROCEDURE PLAN:  The patient will be admitted to the hospital for IV antibiotics, pain control, likely seen and then plan for repeat I and D within 48-72 hours given the extensive infection.     Nathan Done, MD     FWO/MEDQ  D:  10/16/2012  T:  10/17/2012  Job:  [id number]

## 2012-10-17 NOTE — Progress Notes (Signed)
Pod #1 Pt doing well Dressing clean/dry intact Fingers warm well perfused  Plan: Will plan on repeat i/d in or tomorrow Pt voiced understanding of plan

## 2012-10-18 ENCOUNTER — Encounter (HOSPITAL_COMMUNITY): Admission: EM | Disposition: A | Discharge status: Home / Self Care | Payer: Self-pay | Source: Home / Self Care

## 2012-10-18 ENCOUNTER — Encounter (HOSPITAL_COMMUNITY): Payer: Self-pay | Admitting: Anesthesiology

## 2012-10-18 ENCOUNTER — Encounter (HOSPITAL_COMMUNITY): Payer: Self-pay | Admitting: *Deleted

## 2012-10-18 ENCOUNTER — Inpatient Hospital Stay (HOSPITAL_COMMUNITY): Payer: 59 | Admitting: Anesthesiology

## 2012-10-18 HISTORY — PX: I & D EXTREMITY: SHX5045

## 2012-10-18 LAB — SURGICAL PCR SCREEN
MRSA, PCR: POSITIVE — AB
Staphylococcus aureus: POSITIVE — AB

## 2012-10-18 SURGERY — IRRIGATION AND DEBRIDEMENT EXTREMITY
Anesthesia: General | Site: Hand | Laterality: Right | Wound class: Dirty or Infected

## 2012-10-18 MED ORDER — PROPOFOL 10 MG/ML IV BOLUS
INTRAVENOUS | Status: DC | PRN
  Administered 2012-10-18: 200 mg via INTRAVENOUS

## 2012-10-18 MED ORDER — FENTANYL CITRATE 0.05 MG/ML IJ SOLN
INTRAMUSCULAR | Status: DC | PRN
  Administered 2012-10-18: 100 ug via INTRAVENOUS
  Administered 2012-10-18: 50 ug via INTRAVENOUS

## 2012-10-18 MED ORDER — LACTATED RINGERS IV SOLN
INTRAVENOUS | Status: DC
  Administered 2012-10-18: 17:00:00 via INTRAVENOUS

## 2012-10-18 MED ORDER — MIDAZOLAM HCL 5 MG/5ML IJ SOLN
INTRAMUSCULAR | Status: DC | PRN
  Administered 2012-10-18: 2 mg via INTRAVENOUS

## 2012-10-18 MED ORDER — OXYCODONE HCL 5 MG/5ML PO SOLN: 5.0000 mg | Freq: Once | ORAL | Status: AC | PRN

## 2012-10-18 MED ORDER — SODIUM CHLORIDE 0.9 % IR SOLN
Status: DC | PRN
  Administered 2012-10-18: 3000 mL

## 2012-10-18 MED ORDER — ONDANSETRON HCL 4 MG/2ML IJ SOLN: 4.0000 mg | Freq: Four times a day (QID) | INTRAMUSCULAR | Status: DC | PRN

## 2012-10-18 MED ORDER — LACTATED RINGERS IV SOLN
INTRAVENOUS | Status: DC
  Administered 2012-10-18: 18:00:00 via INTRAVENOUS

## 2012-10-18 MED ORDER — OXYCODONE HCL 5 MG PO TABS: 5.0000 mg | ORAL_TABLET | Freq: Once | ORAL | Status: AC | PRN

## 2012-10-18 MED ORDER — ONDANSETRON HCL 4 MG/2ML IJ SOLN
INTRAMUSCULAR | Status: DC | PRN
  Administered 2012-10-18: 4 mg via INTRAVENOUS

## 2012-10-18 MED ORDER — HYDROMORPHONE HCL PF 1 MG/ML IJ SOLN
0.2500 mg | INTRAMUSCULAR | Status: DC | PRN
  Administered 2012-10-18 (×2): 0.5 mg via INTRAVENOUS

## 2012-10-18 SURGICAL SUPPLY — 58 items
BANDAGE CONFORM 2  STR LF (GAUZE/BANDAGES/DRESSINGS) IMPLANT
BANDAGE ELASTIC 3 VELCRO ST LF (GAUZE/BANDAGES/DRESSINGS) ×2 IMPLANT
BANDAGE ELASTIC 4 VELCRO ST LF (GAUZE/BANDAGES/DRESSINGS) ×2 IMPLANT
BANDAGE GAUZE ELAST BULKY 4 IN (GAUZE/BANDAGES/DRESSINGS) ×2 IMPLANT
BNDG CMPR 9X4 STRL LF SNTH (GAUZE/BANDAGES/DRESSINGS) ×1
BNDG COHESIVE 1X5 TAN STRL LF (GAUZE/BANDAGES/DRESSINGS) IMPLANT
BNDG ESMARK 4X9 LF (GAUZE/BANDAGES/DRESSINGS) ×2 IMPLANT
CLOTH BEACON ORANGE TIMEOUT ST (SAFETY) ×2 IMPLANT
CORDS BIPOLAR (ELECTRODE) ×2 IMPLANT
COVER SURGICAL LIGHT HANDLE (MISCELLANEOUS) ×2 IMPLANT
CUFF TOURNIQUET SINGLE 18IN (TOURNIQUET CUFF) ×2 IMPLANT
CUFF TOURNIQUET SINGLE 24IN (TOURNIQUET CUFF) IMPLANT
DRAIN PENROSE 1/4X12 LTX STRL (WOUND CARE) IMPLANT
DRAPE SURG 17X23 STRL (DRAPES) ×2 IMPLANT
DRSG ADAPTIC 3X8 NADH LF (GAUZE/BANDAGES/DRESSINGS) ×2 IMPLANT
DRSG EMULSION OIL 3X3 NADH (GAUZE/BANDAGES/DRESSINGS) ×1 IMPLANT
ELECT REM PT RETURN 9FT ADLT (ELECTROSURGICAL)
ELECTRODE REM PT RTRN 9FT ADLT (ELECTROSURGICAL) IMPLANT
GAUZE XEROFORM 1X8 LF (GAUZE/BANDAGES/DRESSINGS) ×1 IMPLANT
GAUZE XEROFORM 5X9 LF (GAUZE/BANDAGES/DRESSINGS) IMPLANT
GLOVE BIOGEL PI IND STRL 8.5 (GLOVE) ×1 IMPLANT
GLOVE BIOGEL PI INDICATOR 8.5 (GLOVE) ×1
GLOVE SURG ORTHO 8.0 STRL STRW (GLOVE) ×2 IMPLANT
GOWN PREVENTION PLUS XLARGE (GOWN DISPOSABLE) ×2 IMPLANT
GOWN STRL NON-REIN LRG LVL3 (GOWN DISPOSABLE) ×6 IMPLANT
HANDPIECE INTERPULSE COAX TIP (DISPOSABLE)
KIT BASIN OR (CUSTOM PROCEDURE TRAY) ×2 IMPLANT
KIT ROOM TURNOVER OR (KITS) ×2 IMPLANT
MANIFOLD NEPTUNE II (INSTRUMENTS) ×2 IMPLANT
NDL HYPO 25GX1X1/2 BEV (NEEDLE) IMPLANT
NEEDLE HYPO 25GX1X1/2 BEV (NEEDLE) IMPLANT
NS IRRIG 1000ML POUR BTL (IV SOLUTION) ×2 IMPLANT
PACK ORTHO EXTREMITY (CUSTOM PROCEDURE TRAY) ×2 IMPLANT
PAD ARMBOARD 7.5X6 YLW CONV (MISCELLANEOUS) ×4 IMPLANT
PAD CAST 3X4 CTTN HI CHSV (CAST SUPPLIES) IMPLANT
PAD CAST 4YDX4 CTTN HI CHSV (CAST SUPPLIES) ×1 IMPLANT
PADDING CAST COTTON 3X4 STRL (CAST SUPPLIES) ×2
PADDING CAST COTTON 4X4 STRL (CAST SUPPLIES) ×2
SET CYSTO W/LG BORE CLAMP LF (SET/KITS/TRAYS/PACK) ×1 IMPLANT
SET HNDPC FAN SPRY TIP SCT (DISPOSABLE) IMPLANT
SOAP 2 % CHG 4 OZ (WOUND CARE) ×2 IMPLANT
SPLINT FIBERGLASS 3X35 (CAST SUPPLIES) ×1 IMPLANT
SPONGE GAUZE 4X4 12PLY (GAUZE/BANDAGES/DRESSINGS) ×2 IMPLANT
SPONGE LAP 18X18 X RAY DECT (DISPOSABLE) ×2 IMPLANT
SPONGE LAP 4X18 X RAY DECT (DISPOSABLE) ×2 IMPLANT
SUCTION FRAZIER TIP 10 FR DISP (SUCTIONS) ×1 IMPLANT
SUT ETHILON 4 0 PS 2 18 (SUTURE) IMPLANT
SUT ETHILON 5 0 P 3 18 (SUTURE)
SUT NYLON ETHILON 5-0 P-3 1X18 (SUTURE) ×1 IMPLANT
SUT PROLENE 4 0 PS 2 18 (SUTURE) ×2 IMPLANT
SYR CONTROL 10ML LL (SYRINGE) IMPLANT
TOWEL OR 17X24 6PK STRL BLUE (TOWEL DISPOSABLE) ×2 IMPLANT
TOWEL OR 17X26 10 PK STRL BLUE (TOWEL DISPOSABLE) ×2 IMPLANT
TUBE ANAEROBIC SPECIMEN COL (MISCELLANEOUS) IMPLANT
TUBE CONNECTING 12X1/4 (SUCTIONS) ×2 IMPLANT
UNDERPAD 30X30 INCONTINENT (UNDERPADS AND DIAPERS) ×2 IMPLANT
WATER STERILE IRR 1000ML POUR (IV SOLUTION) ×1 IMPLANT
YANKAUER SUCT BULB TIP NO VENT (SUCTIONS) ×2 IMPLANT

## 2012-10-18 NOTE — Anesthesia Procedure Notes (Signed)
Procedure Name: LMA Insertion Date/Time: 10/18/2012 6:36 PM Performed by: Brien Mates DOBSON Pre-anesthesia Checklist: Patient identified, Emergency Drugs available, Suction available, Patient being monitored and Timeout performed Patient Re-evaluated:Patient Re-evaluated prior to inductionOxygen Delivery Method: Circle system utilized Preoxygenation: Pre-oxygenation with 100% oxygen Intubation Type: IV induction Ventilation: Mask ventilation without difficulty LMA: LMA inserted LMA Size: 4.0 Number of attempts: 1 Placement Confirmation: positive ETCO2 and breath sounds checked- equal and bilateral Tube secured with: Tape Dental Injury: Teeth and Oropharynx as per pre-operative assessment

## 2012-10-18 NOTE — Anesthesia Preprocedure Evaluation (Addendum)
Anesthesia Evaluation  Patient identified by MRN, date of birth, ID band Patient awake    Reviewed: Allergy & Precautions, H&P , NPO status , Patient's Chart, lab work & pertinent test results, reviewed documented beta blocker date and time   Airway Mallampati: II  Neck ROM: full    Dental  (+) Dental Advisory Given and Teeth Intact   Pulmonary          Cardiovascular     Neuro/Psych Anxiety Depression    GI/Hepatic   Endo/Other    Renal/GU      Musculoskeletal   Abdominal   Peds  Hematology   Anesthesia Other Findings   Reproductive/Obstetrics                          Anesthesia Physical Anesthesia Plan  ASA: II  Anesthesia Plan: General   Post-op Pain Management:    Induction: Intravenous  Airway Management Planned: LMA  Additional Equipment:   Intra-op Plan:   Post-operative Plan:   Informed Consent: I have reviewed the patients History and Physical, chart, labs and discussed the procedure including the risks, benefits and alternatives for the proposed anesthesia with the patient or authorized representative who has indicated his/her understanding and acceptance.   Dental advisory given  Plan Discussed with: CRNA, Anesthesiologist and Surgeon  Anesthesia Plan Comments:        Anesthesia Quick Evaluation

## 2012-10-18 NOTE — Preoperative (Signed)
Beta Blockers   Reason not to administer Beta Blockers:Not Applicable 

## 2012-10-18 NOTE — Progress Notes (Signed)
Met with Mr Reger on behalf of [employer] to [employer] as a benefit of having [employer]. He reports his wife works for [employer]. He currently does not have any [facility] needs. Left brochure and office number if he should think he should need services in the future.  Raiford Noble, MSN- Ed, RN,BSN- [facility] [phone]

## 2012-10-18 NOTE — Transfer of Care (Signed)
Immediate Anesthesia Transfer of Care Note  Patient: Nathan Sanchez  Procedure(s) Performed: Procedure(s): IRRIGATION AND DEBRIDEMENT RIGHT HAND AND LONG FINGER (Right)  Patient Location: PACU  Anesthesia Type:General  Level of Consciousness: awake, alert  and oriented  Airway & Oxygen Therapy: Patient Spontanous Breathing  Post-op Assessment: Report given to PACU RN and Post -op Vital signs reviewed and stable  Post vital signs: Reviewed and stable  Complications: No apparent anesthesia complications

## 2012-10-18 NOTE — Progress Notes (Signed)
Utilization review completed. Milayah Krell, RN, BSN. 

## 2012-10-18 NOTE — Brief Op Note (Signed)
10/16/2012 - 10/18/2012  7:07 PM  PATIENT:  Nathan Sanchez  52 y.o. male  PRE-OPERATIVE DIAGNOSIS:  Right long finger and hand infection  POST-OPERATIVE DIAGNOSIS:  Right long finger and hand infection  PROCEDURE:  Procedure(s): IRRIGATION AND DEBRIDEMENT RIGHT HAND AND LONG FINGER (Right)  SURGEON:  Surgeon(s) and Role:    * Sharma Covert, MD - Primary  PHYSICIAN ASSISTANT:   ASSISTANTS: none   ANESTHESIA:   general  EBL:     BLOOD ADMINISTERED:none  DRAINS: none   LOCAL MEDICATIONS USED:  NONE  SPECIMEN:  No Specimen  DISPOSITION OF SPECIMEN:  N/A  COUNTS:  YES  TOURNIQUET:    DICTATION: . [id number]  PLAN OF CARE: Admit to inpatient   PATIENT DISPOSITION:  PACU - hemodynamically stable.   Delay start of Pharmacological VTE agent (>24hrs) due to surgical blood loss or risk of bleeding: not applicable

## 2012-10-18 NOTE — Anesthesia Postprocedure Evaluation (Signed)
  Anesthesia Post-op Note  Patient: Nathan Sanchez  Procedure(s) Performed: Procedure(s): IRRIGATION AND DEBRIDEMENT RIGHT HAND AND LONG FINGER (Right)  Patient Location: PACU  Anesthesia Type:General  Level of Consciousness: awake, alert  and oriented  Airway and Oxygen Therapy: Patient Spontanous Breathing  Post-op Pain: mild  Post-op Assessment: Post-op Vital signs reviewed  Post-op Vital Signs: Reviewed  Complications: No apparent anesthesia complications

## 2012-10-18 NOTE — Progress Notes (Signed)
R/B/A DISCUSSED WITH PT IN HOSPITAL.  PT VOICED UNDERSTANDING OF PLAN CONSENT SIGNED DAY OF SURGERY PT SEEN AND EXAMINED PRIOR TO OPERATIVE PROCEDURE/DAY OF SURGERY SITE MARKED. QUESTIONS ANSWERED WILL REMAIN AN INPATIENT FOLLOWING SURGERY 

## 2012-10-19 LAB — CULTURE, ROUTINE-ABSCESS

## 2012-10-19 MED ORDER — CHLORHEXIDINE GLUCONATE CLOTH 2 % EX PADS: 6.0000 | MEDICATED_PAD | Freq: Every day | CUTANEOUS | Status: DC

## 2012-10-19 MED ORDER — SULFAMETHOXAZOLE-TRIMETHOPRIM 800-160 MG PO TABS: 2.0000 | ORAL_TABLET | Freq: Two times a day (BID) | ORAL | Status: AC

## 2012-10-19 MED ORDER — SULFAMETHOXAZOLE-TMP DS 800-160 MG PO TABS
1.0000 | ORAL_TABLET | Freq: Two times a day (BID) | ORAL | Status: DC
  Administered 2012-10-19: 1 via ORAL
  Filled 2012-10-19 (×2): qty 1

## 2012-10-19 MED ORDER — DOCUSATE SODIUM 100 MG PO CAPS: 100.0000 mg | ORAL_CAPSULE | Freq: Two times a day (BID) | ORAL | Status: DC

## 2012-10-19 MED ORDER — MUPIROCIN 2 % EX OINT
1.0000 "application " | TOPICAL_OINTMENT | Freq: Two times a day (BID) | CUTANEOUS | Status: DC
  Administered 2012-10-19: 1 via NASAL
  Filled 2012-10-19: qty 22

## 2012-10-19 MED ORDER — OXYCODONE-ACETAMINOPHEN 10-325 MG PO TABS: 1.0000 | ORAL_TABLET | ORAL | Status: DC | PRN
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
## 2012-10-19 NOTE — Progress Notes (Signed)
During chart check this AM saw PCR screen pre-op was positive for MRSA and Staph. Placed on contact precautions, explained results to pt, MRSA information handout given to pt, and MRSA order set initiated.

## 2012-10-19 NOTE — Op Note (Signed)
NAMEHAYVEN, [name] NO.:  [id number]  MEDICAL RECORD NO.:  [id number]  LOCATION:  [id number]                        FACILITY:  [facility]  PHYSICIAN:  Madelynn Done, MD  DATE OF BIRTH:  [birth year]  DATE OF PROCEDURE:  10/18/2012 DATE OF DISCHARGE:                              OPERATIVE REPORT   PREOPERATIVE DIAGNOSIS:  Right long finger infection and hand infection.  POSTOPERATIVE DIAGNOSIS:  Right long finger infection and hand infection.  ATTENDING PHYSICIAN:  Madelynn Done, MD, who scrubbed and present for the entire procedure.  ASSISTANT SURGEON:  None.  SURGICAL PROCEDURES: 1. Right long finger excisional debridement, skin and subcutaneous     tissues. 2. Right long finger incision and drainage of right long finger     abscess.  ANESTHESIA:  General via LMA.  TOURNIQUET TIME:  0 minutes.  SURGICAL INDICATIONS:  Mr. Lasecki is a right-hand-dominant gentleman who had a right long finger infection.  He was brought to the operating on October 16, 2012.  He was scheduled for repeat I and D and given the severity of infection.  Risks, benefits and alternatives were discussed in detail with the patient, and signed informed consent was obtained. Risks include, but not limited to bleeding, infection, damage to nearby nerves, arteries, or tendons, loss of motion of wrist and digits, incomplete relief of symptoms, and need for further surgical intervention.  DESCRIPTION OF PROCEDURE:  The patient was properly identified in the preoperative holding area and marked with a permanent marker, made on the right long finger and hand to indicate the correct operative site. The patient was then brought back to the operative room and placed supine on the anesthesia room table where general anesthesia was administered.  The patient was receiving IV vancomycin.  Right upper extremity was then prepped and draped in normal sterile fashion.  Time- out was called after  anesthesia was induced via LMA.  Right upper extremity was then prepped and draped in normal sterile fashion.  Time- out was called.  Correct site was identified, and the procedure was then begun.  Attention was then turned to the right long finger for the previous skin sutures and drain were then removed.  The skin incision was then opened up to the healthier tissue.  Excisional debridement of the skin and subcutaneous tissue, it was then carried out sharply with knives and rongeurs.  After excisional debridement was then carried out, drainage of the abscess area was then copiously drained and irrigated with 3 liters of saline solution.  The wound was then irrigated and then closed loosely with a simple Prolene sutures.  Adaptic dressing and sterile compressive bandage were then applied.  The patient tolerated the procedure well, was placed a sterile compressive bandage and a dorsal blocking splint.  The patient was then extubated and taken to the recovery room in good condition.  POSTOPERATIVE PLAN:  The patient to be admitted back to the floor for IV antibiotics and pain control, likely discharge in the a.m. on oral antibiotics based on this culture results, and then followed back in the office for local wound care and then begin an outpatient therapy regimen.  Madelynn Done, MD     FWO/MEDQ  D:  10/18/2012  T:  10/19/2012  Job:  [id number]

## 2012-10-19 NOTE — Progress Notes (Signed)
Lab called re; pt's culture results, positive for MRSA and streph.  Patient on contact precautions already.

## 2012-10-21 ENCOUNTER — Encounter (HOSPITAL_COMMUNITY): Payer: Self-pay | Admitting: Orthopedic Surgery

## 2012-10-21 LAB — ANAEROBIC CULTURE

## 2012-10-22 LAB — CULTURE, BLOOD (ROUTINE X 2)

## 2013-01-05 ENCOUNTER — Emergency Department (HOSPITAL_COMMUNITY): Payer: 59

## 2013-01-05 ENCOUNTER — Emergency Department (HOSPITAL_COMMUNITY)
Admission: EM | Admit: 2013-01-05 | Discharge: 2013-01-05 | Disposition: A | Discharge status: Home / Self Care | Payer: 59 | Attending: Emergency Medicine | Admitting: Emergency Medicine

## 2013-01-05 ENCOUNTER — Encounter (HOSPITAL_COMMUNITY): Payer: Self-pay | Admitting: Emergency Medicine

## 2013-01-05 DIAGNOSIS — Z8719 Personal history of other diseases of the digestive system: Secondary | ICD-10-CM | POA: Insufficient documentation

## 2013-01-05 DIAGNOSIS — S59919A Unspecified injury of unspecified forearm, initial encounter: Secondary | ICD-10-CM | POA: Insufficient documentation

## 2013-01-05 DIAGNOSIS — M25521 Pain in right elbow: Secondary | ICD-10-CM

## 2013-01-05 DIAGNOSIS — F411 Generalized anxiety disorder: Secondary | ICD-10-CM | POA: Insufficient documentation

## 2013-01-05 DIAGNOSIS — S59909A Unspecified injury of unspecified elbow, initial encounter: Secondary | ICD-10-CM | POA: Insufficient documentation

## 2013-01-05 DIAGNOSIS — Z87828 Personal history of other (healed) physical injury and trauma: Secondary | ICD-10-CM | POA: Insufficient documentation

## 2013-01-05 DIAGNOSIS — S6990XA Unspecified injury of unspecified wrist, hand and finger(s), initial encounter: Secondary | ICD-10-CM | POA: Insufficient documentation

## 2013-01-05 DIAGNOSIS — Z8669 Personal history of other diseases of the nervous system and sense organs: Secondary | ICD-10-CM | POA: Insufficient documentation

## 2013-01-05 DIAGNOSIS — F3289 Other specified depressive episodes: Secondary | ICD-10-CM | POA: Insufficient documentation

## 2013-01-05 DIAGNOSIS — F329 Major depressive disorder, single episode, unspecified: Secondary | ICD-10-CM | POA: Insufficient documentation

## 2013-01-05 MED ORDER — HYDROCODONE-ACETAMINOPHEN 5-325 MG PO TABS: 1.0000 | ORAL_TABLET | ORAL | Status: DC | PRN

## 2013-01-05 NOTE — ED Provider Notes (Signed)
Medical screening examination/treatment/procedure(s) were performed by non-physician practitioner and as supervising physician I was immediately available for consultation/collaboration.  Flint Melter, MD 01/05/13 [phone]
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
## 2013-07-01 ENCOUNTER — Emergency Department (HOSPITAL_COMMUNITY)
Admission: EM | Admit: 2013-07-01 | Discharge: 2013-07-02 | Disposition: A | Discharge status: Home / Self Care | Payer: 59 | Attending: Emergency Medicine | Admitting: Emergency Medicine

## 2013-07-01 ENCOUNTER — Encounter (HOSPITAL_COMMUNITY): Payer: Self-pay | Admitting: Emergency Medicine

## 2013-07-01 DIAGNOSIS — Z23 Encounter for immunization: Secondary | ICD-10-CM | POA: Insufficient documentation

## 2013-07-01 DIAGNOSIS — Z8659 Personal history of other mental and behavioral disorders: Secondary | ICD-10-CM | POA: Insufficient documentation

## 2013-07-01 DIAGNOSIS — Z8669 Personal history of other diseases of the nervous system and sense organs: Secondary | ICD-10-CM | POA: Insufficient documentation

## 2013-07-01 DIAGNOSIS — W268XXA Contact with other sharp object(s), not elsewhere classified, initial encounter: Secondary | ICD-10-CM | POA: Insufficient documentation

## 2013-07-01 DIAGNOSIS — S61209A Unspecified open wound of unspecified finger without damage to nail, initial encounter: Secondary | ICD-10-CM | POA: Insufficient documentation

## 2013-07-01 DIAGNOSIS — Y929 Unspecified place or not applicable: Secondary | ICD-10-CM | POA: Insufficient documentation

## 2013-07-01 DIAGNOSIS — S61011A Laceration without foreign body of right thumb without damage to nail, initial encounter: Secondary | ICD-10-CM

## 2013-07-01 DIAGNOSIS — Y939 Activity, unspecified: Secondary | ICD-10-CM | POA: Insufficient documentation

## 2013-07-01 MED ORDER — HYDROCODONE-ACETAMINOPHEN 5-325 MG PO TABS: ORAL_TABLET | ORAL | Status: DC

## 2013-07-01 MED ORDER — LIDOCAINE HCL (PF) 1 % IJ SOLN
INTRAMUSCULAR | Status: AC
  Administered 2013-07-01: 5 mL via INTRADERMAL
  Filled 2013-07-01: qty 5

## 2013-07-01 MED ORDER — TETANUS-DIPHTH-ACELL PERTUSSIS 5-2.5-18.5 LF-MCG/0.5 IM SUSP
0.5000 mL | Freq: Once | INTRAMUSCULAR | Status: AC
  Administered 2013-07-01: 0.5 mL via INTRAMUSCULAR
  Filled 2013-07-01: qty 0.5

## 2013-07-01 MED ORDER — LIDOCAINE HCL (PF) 1 % IJ SOLN
5.0000 mL | Freq: Once | INTRAMUSCULAR | Status: AC
Start: 2013-07-01 — End: 2013-07-01
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
## 2013-07-01 NOTE — ED Notes (Signed)
Right thumb laceration per pt. Cut with a piece of glass per pt.

## 2013-07-01 NOTE — Discharge Instructions (Signed)
Laceration Care, Adult °A laceration is a cut that goes through all layers of the skin. The cut goes into the tissue beneath the skin. °HOME CARE °For stitches (sutures) or staples: °· Keep the cut clean and dry. °· If you have a bandage (dressing), change it at least once a day. Change the bandage if it gets wet or dirty, or as told by your doctor. °· Wash the cut with soap and water 2 times a day. Rinse the cut with water. Pat it dry with a clean towel. °· Put a thin layer of medicated cream on the cut as told by your doctor. °· You may shower after the first 24 hours. Do not soak the cut in water until the stitches are removed. °· Only take medicines as told by your doctor. °· Have your stitches or staples removed as told by your doctor. °For skin adhesive strips: °· Keep the cut clean and dry. °· Do not get the strips wet. You may take a bath, but be careful to keep the cut dry. °· If the cut gets wet, pat it dry with a clean towel. °· The strips will fall off on their own. Do not remove the strips that are still stuck to the cut. °For wound glue: °· You may shower or take baths. Do not soak or scrub the cut. Do not swim. Avoid heavy sweating until the glue falls off on its own. After a shower or bath, pat the cut dry with a clean towel. °· Do not put medicine on your cut until the glue falls off. °· If you have a bandage, do not put tape over the glue. °· Avoid lots of sunlight or tanning lamps until the glue falls off. Put sunscreen on the cut for the first year to reduce your scar. °· The glue will fall off on its own. Do not pick at the glue. °You may need a tetanus shot if: °· You cannot remember when you had your last tetanus shot. °· You have never had a tetanus shot. °If you need a tetanus shot and you choose not to have one, you may get tetanus. Sickness from tetanus can be serious. °GET HELP RIGHT AWAY IF:  °· Your pain does not get better with medicine. °· Your arm, hand, leg, or foot loses feeling  (numbness) or changes color. °· Your cut is bleeding. °· Your joint feels weak, or you cannot use your joint. °· You have painful lumps on your body. °· Your cut is red, puffy (swollen), or painful. °· You have a red line on the skin near the cut. °· You have yellowish-white fluid (pus) coming from the cut. °· You have a fever. °· You have a bad smell coming from the cut or bandage. °· Your cut breaks open before or after stitches are removed. °· You notice something coming out of the cut, such as wood or glass. °· You cannot move a finger or toe. °MAKE SURE YOU:  °· Understand these instructions. °· Will watch your condition. °· Will get help right away if you are not doing well or get worse. °Document Released: 11/22/2007 Document Revised: 08/28/2011 Document Reviewed: 11/29/2010 °[employer]® Patient Information ©2014 [employer]. ° ° ° ° °

## 2013-07-02 NOTE — ED Provider Notes (Signed)
Medical screening examination/treatment/procedure(s) were performed by non-physician practitioner and as supervising physician I was immediately available for consultation/collaboration.  EKG Interpretation   None             Laray AngerKathleen M Shelsea Hangartner, DO 07/02/13 1752

## 2013-07-16 MED FILL — Hydrocodone-Acetaminophen Tab 5-325 MG: ORAL | Qty: 6 | Status: AC

## 2013-08-07 ENCOUNTER — Ambulatory Visit (HOSPITAL_COMMUNITY): Payer: Self-pay | Admitting: Psychiatry

## 2013-08-12 ENCOUNTER — Ambulatory Visit (HOSPITAL_COMMUNITY): Payer: Self-pay | Admitting: Psychiatry

## 2013-09-04 ENCOUNTER — Ambulatory Visit (HOSPITAL_COMMUNITY): Payer: Self-pay | Admitting: Psychiatry

## 2014-04-27 ENCOUNTER — Inpatient Hospital Stay (HOSPITAL_COMMUNITY)
Admission: EM | Admit: 2014-04-27 | Discharge: 2014-05-02 | DRG: 981 | Disposition: A | Discharge status: Home Health | Payer: Medicare Other | Attending: Surgery | Admitting: Surgery

## 2014-04-27 ENCOUNTER — Encounter (HOSPITAL_COMMUNITY): Payer: Self-pay | Admitting: Emergency Medicine

## 2014-04-27 ENCOUNTER — Emergency Department (HOSPITAL_COMMUNITY): Payer: Medicare Other | Admitting: Anesthesiology

## 2014-04-27 ENCOUNTER — Encounter (HOSPITAL_COMMUNITY): Admission: EM | Disposition: A | Discharge status: Home Health | Payer: Self-pay | Source: Home / Self Care

## 2014-04-27 ENCOUNTER — Emergency Department (HOSPITAL_COMMUNITY): Payer: Medicare Other

## 2014-04-27 DIAGNOSIS — W320XXA Accidental handgun discharge, initial encounter: Secondary | ICD-10-CM

## 2014-04-27 DIAGNOSIS — D62 Acute posthemorrhagic anemia: Secondary | ICD-10-CM | POA: Diagnosis present

## 2014-04-27 DIAGNOSIS — S36501A Unspecified injury of transverse colon, initial encounter: Secondary | ICD-10-CM | POA: Diagnosis present

## 2014-04-27 DIAGNOSIS — Y848 Other medical procedures as the cause of abnormal reaction of the patient, or of later complication, without mention of misadventure at the time of the procedure: Secondary | ICD-10-CM | POA: Diagnosis not present

## 2014-04-27 DIAGNOSIS — S36591A Other injury of transverse colon, initial encounter: Secondary | ICD-10-CM | POA: Diagnosis present

## 2014-04-27 DIAGNOSIS — S31605A Unspecified open wound of abdominal wall, periumbilic region with penetration into peritoneal cavity, initial encounter: Secondary | ICD-10-CM | POA: Diagnosis present

## 2014-04-27 DIAGNOSIS — K661 Hemoperitoneum: Secondary | ICD-10-CM | POA: Diagnosis present

## 2014-04-27 DIAGNOSIS — I959 Hypotension, unspecified: Secondary | ICD-10-CM | POA: Diagnosis not present

## 2014-04-27 DIAGNOSIS — E871 Hypo-osmolality and hyponatremia: Secondary | ICD-10-CM | POA: Diagnosis not present

## 2014-04-27 DIAGNOSIS — J9589 Other postprocedural complications and disorders of respiratory system, not elsewhere classified: Secondary | ICD-10-CM

## 2014-04-27 DIAGNOSIS — T794XXA Traumatic shock, initial encounter: Secondary | ICD-10-CM | POA: Diagnosis present

## 2014-04-27 DIAGNOSIS — S36509A Unspecified injury of unspecified part of colon, initial encounter: Secondary | ICD-10-CM | POA: Diagnosis present

## 2014-04-27 DIAGNOSIS — Z9889 Other specified postprocedural states: Secondary | ICD-10-CM

## 2014-04-27 DIAGNOSIS — S3510XA Unspecified injury of inferior vena cava, initial encounter: Secondary | ICD-10-CM | POA: Diagnosis present

## 2014-04-27 DIAGNOSIS — G9341 Metabolic encephalopathy: Secondary | ICD-10-CM | POA: Diagnosis not present

## 2014-04-27 DIAGNOSIS — T1490XA Injury, unspecified, initial encounter: Secondary | ICD-10-CM

## 2014-04-27 DIAGNOSIS — S31139A Puncture wound of abdominal wall without foreign body, unspecified quadrant without penetration into peritoneal cavity, initial encounter: Secondary | ICD-10-CM

## 2014-04-27 DIAGNOSIS — T8092XA Unspecified transfusion reaction, initial encounter: Secondary | ICD-10-CM | POA: Diagnosis not present

## 2014-04-27 DIAGNOSIS — R0603 Acute respiratory distress: Secondary | ICD-10-CM

## 2014-04-27 DIAGNOSIS — K567 Ileus, unspecified: Secondary | ICD-10-CM | POA: Diagnosis not present

## 2014-04-27 DIAGNOSIS — J9691 Respiratory failure, unspecified with hypoxia: Secondary | ICD-10-CM | POA: Diagnosis not present

## 2014-04-27 DIAGNOSIS — S36899A Unspecified injury of other intra-abdominal organs, initial encounter: Secondary | ICD-10-CM | POA: Diagnosis present

## 2014-04-27 DIAGNOSIS — W3400XA Accidental discharge from unspecified firearms or gun, initial encounter: Secondary | ICD-10-CM

## 2014-04-27 DIAGNOSIS — S2521XA Minor laceration of superior vena cava, initial encounter: Secondary | ICD-10-CM

## 2014-04-27 HISTORY — PX: LAPAROTOMY: SHX154

## 2014-04-27 HISTORY — DX: Dorsalgia, unspecified: M54.9

## 2014-04-27 LAB — COMPREHENSIVE METABOLIC PANEL
ALK PHOS: 164 U/L — AB (ref 39–117)
ALT: 59 U/L — AB (ref 0–53)
AST: 60 U/L — ABNORMAL HIGH (ref 0–37)
Albumin: 2.9 g/dL — ABNORMAL LOW (ref 3.5–5.2)
Anion gap: 13 (ref 5–15)
BUN: 7 mg/dL (ref 6–23)
CO2: 24 meq/L (ref 19–32)
Calcium: 7.9 mg/dL — ABNORMAL LOW (ref 8.4–10.5)
Chloride: 104 mEq/L (ref 96–112)
Creatinine, Ser: 1 mg/dL (ref 0.50–1.35)
GFR calc Af Amer: >90 mL/min (ref >90)
GFR, EST NON AFRICAN AMERICAN: 85 mL/min — AB (ref >90)
GLUCOSE: 123 mg/dL — AB (ref 70–99)
POTASSIUM: 3.1 meq/L — AB (ref 3.7–5.3)
SODIUM: 141 meq/L (ref 137–147)
TOTAL PROTEIN: 6 g/dL (ref 6.0–8.3)
Total Bilirubin: <0.2 mg/dL — ABNORMAL LOW (ref 0.3–1.2)

## 2014-04-27 LAB — POCT I-STAT, CHEM 8
BUN: 6 mg/dL (ref 6–23)
CREATININE: 1.1 mg/dL (ref 0.50–1.35)
Calcium, Ion: 1.05 mmol/L — ABNORMAL LOW (ref 1.12–1.23)
Chloride: 104 mEq/L (ref 96–112)
Glucose, Bld: 125 mg/dL — ABNORMAL HIGH (ref 70–99)
HCT: 39 % (ref 39.0–52.0)
Hemoglobin: 13.3 g/dL (ref 13.0–17.0)
POTASSIUM: 3 meq/L — AB (ref 3.7–5.3)
SODIUM: 140 meq/L (ref 137–147)
TCO2: 24 mmol/L (ref 0–100)

## 2014-04-27 LAB — CBC
HCT: 36.2 % — ABNORMAL LOW (ref 39.0–52.0)
HEMOGLOBIN: 12.6 g/dL — AB (ref 13.0–17.0)
MCH: 34.1 pg — AB (ref 26.0–34.0)
MCHC: 34.8 g/dL (ref 30.0–36.0)
MCV: 97.8 fL (ref 78.0–100.0)
Platelets: 280 10*3/uL (ref 150–400)
RBC: 3.7 MIL/uL — AB (ref 4.22–5.81)
RDW: 13.3 % (ref 11.5–15.5)
WBC: 5.2 10*3/uL (ref 4.0–10.5)

## 2014-04-27 LAB — PROTIME-INR
INR: 1.04 (ref 0.00–1.49)
Prothrombin Time: 13.7 seconds (ref 11.6–15.2)

## 2014-04-27 LAB — CG4 I-STAT (LACTIC ACID): Lactic Acid, Venous: 2.03 mmol/L (ref 0.5–2.2)

## 2014-04-27 LAB — CDS SEROLOGY

## 2014-04-27 LAB — POCT I-STAT TROPONIN I: TROPONIN I, POC: 0 ng/mL (ref 0.00–0.08)

## 2014-04-27 LAB — ETHANOL: Alcohol, Ethyl (B): 40 mg/dL — ABNORMAL HIGH (ref 0–11)

## 2014-04-27 LAB — PREPARE RBC (CROSSMATCH)

## 2014-04-27 LAB — ABO/RH: ABO/RH(D): A POS

## 2014-04-27 SURGERY — LAPAROTOMY, EXPLORATORY
Anesthesia: General

## 2014-04-27 MED ORDER — STERILE WATER FOR INJECTION IJ SOLN
INTRAMUSCULAR | Status: AC
  Filled 2014-04-27: qty 10

## 2014-04-27 MED ORDER — ALBUMIN HUMAN 5 % IV SOLN
INTRAVENOUS | Status: DC | PRN
  Administered 2014-04-27 (×2): via INTRAVENOUS

## 2014-04-27 MED ORDER — FENTANYL CITRATE 0.05 MG/ML IJ SOLN
INTRAMUSCULAR | Status: AC
  Filled 2014-04-27: qty 4

## 2014-04-27 MED ORDER — VECURONIUM BROMIDE 10 MG IV SOLR
INTRAVENOUS | Status: DC | PRN
  Administered 2014-04-27: 2 mg via INTRAVENOUS
  Administered 2014-04-27: 4 mg via INTRAVENOUS
  Administered 2014-04-27 (×3): 2 mg via INTRAVENOUS

## 2014-04-27 MED ORDER — FENTANYL CITRATE 0.05 MG/ML IJ SOLN
INTRAMUSCULAR | Status: AC
  Filled 2014-04-27: qty 5

## 2014-04-27 MED ORDER — SODIUM CHLORIDE 0.9 % IV SOLN: Freq: Once | INTRAVENOUS | Status: DC

## 2014-04-27 MED ORDER — SUCCINYLCHOLINE CHLORIDE 20 MG/ML IJ SOLN
INTRAMUSCULAR | Status: DC | PRN
  Administered 2014-04-27: 140 mg via INTRAVENOUS
  Administered 2014-04-28: 100 mg via INTRAVENOUS

## 2014-04-27 MED ORDER — PROPOFOL 10 MG/ML IV BOLUS
INTRAVENOUS | Status: DC | PRN
  Administered 2014-04-27: 200 mg via INTRAVENOUS
  Administered 2014-04-28: 150 mg via INTRAVENOUS

## 2014-04-27 MED ORDER — LACTATED RINGERS IV SOLN
INTRAVENOUS | Status: DC | PRN
  Administered 2014-04-27 – 2014-04-28 (×3): via INTRAVENOUS

## 2014-04-27 MED ORDER — VECURONIUM BROMIDE 10 MG IV SOLR
INTRAVENOUS | Status: AC
Start: 2014-04-27 — End: 2014-04-27
  Filled 2014-04-27: qty 10

## 2014-04-27 MED ORDER — LIDOCAINE HCL (CARDIAC) 20 MG/ML IV SOLN
INTRAVENOUS | Status: DC | PRN
  Administered 2014-04-27: 60 mg via INTRAVENOUS

## 2014-04-27 MED ORDER — ARTIFICIAL TEARS OP OINT
TOPICAL_OINTMENT | OPHTHALMIC | Status: DC | PRN
  Administered 2014-04-27: 1 via OPHTHALMIC

## 2014-04-27 MED ORDER — LIDOCAINE HCL (CARDIAC) 20 MG/ML IV SOLN
INTRAVENOUS | Status: AC
  Filled 2014-04-27: qty 5

## 2014-04-27 MED ORDER — LACTATED RINGERS IV SOLN
INTRAVENOUS | Status: DC | PRN
  Administered 2014-04-27: 22:00:00 via INTRAVENOUS

## 2014-04-27 MED ORDER — FENTANYL CITRATE 0.05 MG/ML IJ SOLN
INTRAMUSCULAR | Status: DC | PRN
  Administered 2014-04-27: 150 ug via INTRAVENOUS
  Administered 2014-04-27 (×2): 50 ug via INTRAVENOUS
  Administered 2014-04-28 (×2): 100 ug via INTRAVENOUS
  Administered 2014-04-28: 50 ug via INTRAVENOUS

## 2014-04-27 MED ORDER — EPHEDRINE SULFATE 50 MG/ML IJ SOLN
INTRAMUSCULAR | Status: DC | PRN
  Administered 2014-04-27 (×2): 5 mg via INTRAVENOUS
  Administered 2014-04-27 (×2): 10 mg via INTRAVENOUS
  Administered 2014-04-27: 5 mg via INTRAVENOUS

## 2014-04-27 MED ORDER — VECURONIUM BROMIDE 10 MG IV SOLR
INTRAVENOUS | Status: AC
  Filled 2014-04-27: qty 10

## 2014-04-27 MED ORDER — SUCCINYLCHOLINE CHLORIDE 20 MG/ML IJ SOLN
INTRAMUSCULAR | Status: AC
  Filled 2014-04-27: qty 1

## 2014-04-27 MED ORDER — CEFAZOLIN SODIUM-DEXTROSE 2-3 GM-% IV SOLR
INTRAVENOUS | Status: DC | PRN
  Administered 2014-04-27: 2 g via INTRAVENOUS

## 2014-04-27 MED ORDER — PROPOFOL 10 MG/ML IV BOLUS
INTRAVENOUS | Status: AC
Start: 2014-04-27 — End: 2014-04-27
  Filled 2014-04-27: qty 20

## 2014-04-27 MED ORDER — ARTIFICIAL TEARS OP OINT
TOPICAL_OINTMENT | OPHTHALMIC | Status: AC
Start: 2014-04-27 — End: 2014-04-27
  Filled 2014-04-27: qty 3.5

## 2014-04-27 SURGICAL SUPPLY — 59 items
BLADE SURG ROTATE 9660 (MISCELLANEOUS) ×2 IMPLANT
CANISTER SUCTION 2500CC (MISCELLANEOUS) ×3 IMPLANT
CATH ROBINSON RED A/P 14FR (CATHETERS) ×2 IMPLANT
CATH ROBINSON RED A/P 16FR (CATHETERS) ×2 IMPLANT
CLIP TI MEDIUM 6 (CLIP) ×4 IMPLANT
CLIP TI WIDE RED SMALL 6 (CLIP) ×2 IMPLANT
COVER MAYO STAND STRL (DRAPES) IMPLANT
COVER SURGICAL LIGHT HANDLE (MISCELLANEOUS) ×3 IMPLANT
DRAPE LAPAROSCOPIC ABDOMINAL (DRAPES) ×3 IMPLANT
DRAPE PROXIMA HALF (DRAPES) IMPLANT
DRAPE UTILITY 15X26 W/TAPE STR (DRAPE) ×6 IMPLANT
DRAPE WARM FLUID 44X44 (DRAPE) ×3 IMPLANT
DRSG OPSITE POSTOP 4X10 (GAUZE/BANDAGES/DRESSINGS) IMPLANT
DRSG OPSITE POSTOP 4X8 (GAUZE/BANDAGES/DRESSINGS) IMPLANT
DRSG VAC ATS LRG SENSATRAC (GAUZE/BANDAGES/DRESSINGS) ×2 IMPLANT
ELECT BLADE 6.5 EXT (BLADE) ×2 IMPLANT
ELECT CAUTERY BLADE 6.4 (BLADE) ×4 IMPLANT
ELECT REM PT RETURN 9FT ADLT (ELECTROSURGICAL) ×3
ELECTRODE REM PT RTRN 9FT ADLT (ELECTROSURGICAL) ×1 IMPLANT
GAUZE SPONGE 4X4 16PLY XRAY LF (GAUZE/BANDAGES/DRESSINGS) ×2 IMPLANT
GLOVE BIO SURGEON STRL SZ 6.5 (GLOVE) ×2 IMPLANT
GLOVE BIO SURGEON STRL SZ7 (GLOVE) ×7 IMPLANT
GLOVE BIO SURGEON STRL SZ7.5 (GLOVE) ×2 IMPLANT
GLOVE BIO SURGEONS STRL SZ 6.5 (GLOVE) ×2
GLOVE BIOGEL PI IND STRL 7.5 (GLOVE) ×1 IMPLANT
GLOVE BIOGEL PI INDICATOR 7.5 (GLOVE) ×4
GLOVE EUDERMIC 7 POWDERFREE (GLOVE) ×2 IMPLANT
GOWN STRL REUS W/ TWL LRG LVL3 (GOWN DISPOSABLE) ×3 IMPLANT
GOWN STRL REUS W/TWL LRG LVL3 (GOWN DISPOSABLE) ×9
KIT BASIN OR (CUSTOM PROCEDURE TRAY) ×3 IMPLANT
KIT ROOM TURNOVER OR (KITS) ×3 IMPLANT
LIGASURE IMPACT 36 18CM CVD LR (INSTRUMENTS) ×2 IMPLANT
NS IRRIG 1000ML POUR BTL (IV SOLUTION) ×6 IMPLANT
PACK GENERAL/GYN (CUSTOM PROCEDURE TRAY) ×3 IMPLANT
PAD ARMBOARD 7.5X6 YLW CONV (MISCELLANEOUS) ×3 IMPLANT
PAD NEG PRESSURE SENSATRAC (MISCELLANEOUS) ×2 IMPLANT
PENCIL BUTTON HOLSTER BLD 10FT (ELECTRODE) IMPLANT
SPECIMEN JAR LARGE (MISCELLANEOUS) IMPLANT
SPONGE GAUZE 4X4 12PLY STER LF (GAUZE/BANDAGES/DRESSINGS) ×2 IMPLANT
SPONGE LAP 18X18 X RAY DECT (DISPOSABLE) ×12 IMPLANT
STAPLER VISISTAT 35W (STAPLE) ×3 IMPLANT
SUCTION POOLE TIP (SUCTIONS) ×3 IMPLANT
SUT PDS AB 1 TP1 96 (SUTURE) ×6 IMPLANT
SUT PROLENE 3 0 SH DA (SUTURE) ×2 IMPLANT
SUT PROLENE 5 0 C1 (SUTURE) ×4 IMPLANT
SUT SILK 2 0 SH CR/8 (SUTURE) ×3 IMPLANT
SUT SILK 2 0 TIES 10X30 (SUTURE) ×3 IMPLANT
SUT SILK 3 0 SH CR/8 (SUTURE) ×7 IMPLANT
SUT SILK 3 0 TIES 10X30 (SUTURE) ×3 IMPLANT
SUT VIC AB 3-0 SH 18 (SUTURE) IMPLANT
SUT VIC AB 3-0 SH 27 (SUTURE) ×3
SUT VIC AB 3-0 SH 27X BRD (SUTURE) IMPLANT
TAPE CLOTH SURG 4X10 WHT LF (GAUZE/BANDAGES/DRESSINGS) ×2 IMPLANT
TAPE UMBILICAL COTTON 1/8X30 (MISCELLANEOUS) ×2 IMPLANT
TOWEL OR 17X26 10 PK STRL BLUE (TOWEL DISPOSABLE) ×3 IMPLANT
TRAY FOLEY CATH 16FRSI W/METER (SET/KITS/TRAYS/PACK) ×2 IMPLANT
TUBE CONNECTING 12'X1/4 (SUCTIONS)
TUBE CONNECTING 12X1/4 (SUCTIONS) IMPLANT
YANKAUER SUCT BULB TIP NO VENT (SUCTIONS) IMPLANT
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
## 2014-04-27 NOTE — ED Notes (Signed)
Pt's belongings were brought in by EMS in large red plastic bag.  [facility](Dan Hardy)

## 2014-04-27 NOTE — ED Notes (Signed)
Warm blankets applied to pt.  
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
## 2014-04-27 NOTE — ED Notes (Signed)
Heat turned up in room for warming measures
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
## 2014-04-27 NOTE — ED Notes (Signed)
One black wallet with no money and 1 chain necklace with charm given to chaplin to give family

## 2014-04-27 NOTE — Anesthesia Procedure Notes (Signed)
Procedure Name: Intubation Date/Time: 04/27/2014 9:46 PM Performed by: Luster LandsbergHASE, Rico Massar R Pre-anesthesia Checklist: Patient identified, Emergency Drugs available, Suction available and Patient being monitored Patient Re-evaluated:Patient Re-evaluated prior to inductionOxygen Delivery Method: Circle system utilized Preoxygenation: Pre-oxygenation with 100% oxygen Intubation Type: IV induction, Rapid sequence and Cricoid Pressure applied Grade View: Grade I Tube type: Oral Tube size: 7.5 mm Number of attempts: 1 Airway Equipment and Method: Video-laryngoscopy and Stylet Placement Confirmation: ETT inserted through vocal cords under direct vision,  positive ETCO2 and breath sounds checked- equal and bilateral Secured at: 22 cm Tube secured with: Tape Dental Injury: Teeth and Oropharynx as per pre-operative assessment

## 2014-04-28 ENCOUNTER — Inpatient Hospital Stay (HOSPITAL_COMMUNITY): Payer: Medicare Other

## 2014-04-28 DIAGNOSIS — T8092XA Unspecified transfusion reaction, initial encounter: Secondary | ICD-10-CM

## 2014-04-28 DIAGNOSIS — R40244 Other coma, without documented Glasgow coma scale score, or with partial score reported: Secondary | ICD-10-CM

## 2014-04-28 DIAGNOSIS — S3510XA Unspecified injury of inferior vena cava, initial encounter: Secondary | ICD-10-CM | POA: Diagnosis present

## 2014-04-28 DIAGNOSIS — J9620 Acute and chronic respiratory failure, unspecified whether with hypoxia or hypercapnia: Secondary | ICD-10-CM

## 2014-04-28 LAB — CBC
HCT: 35.4 % — ABNORMAL LOW (ref 39.0–52.0)
HEMATOCRIT: 34.2 % — AB (ref 39.0–52.0)
HEMOGLOBIN: 11.9 g/dL — AB (ref 13.0–17.0)
Hemoglobin: 12.4 g/dL — ABNORMAL LOW (ref 13.0–17.0)
MCH: 33.1 pg (ref 26.0–34.0)
MCH: 33.2 pg (ref 26.0–34.0)
MCHC: 34.8 g/dL (ref 30.0–36.0)
MCHC: 35 g/dL (ref 30.0–36.0)
MCV: 95 fL (ref 78.0–100.0)
MCV: 94.7 fL (ref 78.0–100.0)
Platelets: 225 10*3/uL (ref 150–400)
Platelets: 267 10*3/uL (ref 150–400)
RBC: 3.6 MIL/uL — AB (ref 4.22–5.81)
RBC: 3.74 MIL/uL — ABNORMAL LOW (ref 4.22–5.81)
RDW: 14.8 % (ref 11.5–15.5)
RDW: 16.1 % — ABNORMAL HIGH (ref 11.5–15.5)
WBC: 4.6 10*3/uL (ref 4.0–10.5)
WBC: 9.9 10*3/uL (ref 4.0–10.5)

## 2014-04-28 LAB — POCT I-STAT 4, (NA,K, GLUC, HGB,HCT)
GLUCOSE: 124 mg/dL — AB (ref 70–99)
Glucose, Bld: 133 mg/dL — ABNORMAL HIGH (ref 70–99)
HCT: 34 % — ABNORMAL LOW (ref 39.0–52.0)
HCT: 26 % — ABNORMAL LOW (ref 39.0–52.0)
Hemoglobin: 11.6 g/dL — ABNORMAL LOW (ref 13.0–17.0)
Hemoglobin: 8.8 g/dL — ABNORMAL LOW (ref 13.0–17.0)
POTASSIUM: 3.6 meq/L — AB (ref 3.7–5.3)
Potassium: 3.9 mEq/L (ref 3.7–5.3)
SODIUM: 140 meq/L (ref 137–147)
Sodium: 140 mEq/L (ref 137–147)

## 2014-04-28 LAB — POCT I-STAT 3, ART BLOOD GAS (G3+)
ACID-BASE DEFICIT: 2 mmol/L (ref 0.0–2.0)
BICARBONATE: 24.2 meq/L — AB (ref 20.0–24.0)
O2 Saturation: 99 %
Patient temperature: 97.7
TCO2: 26 mmol/L (ref 0–100)
pCO2 arterial: 43.6 mmHg (ref 35.0–45.0)
pH, Arterial: 7.351 (ref 7.350–7.450)
pO2, Arterial: 146 mmHg — ABNORMAL HIGH (ref 80.0–100.0)

## 2014-04-28 LAB — COMPREHENSIVE METABOLIC PANEL
ALK PHOS: 100 U/L (ref 39–117)
ALT: 38 U/L (ref 0–53)
ANION GAP: 13 (ref 5–15)
AST: 36 U/L (ref 0–37)
Albumin: 2.8 g/dL — ABNORMAL LOW (ref 3.5–5.2)
BILIRUBIN TOTAL: 1 mg/dL (ref 0.3–1.2)
BUN: 9 mg/dL (ref 6–23)
CHLORIDE: 105 meq/L (ref 96–112)
CO2: 22 meq/L (ref 19–32)
Calcium: 7.3 mg/dL — ABNORMAL LOW (ref 8.4–10.5)
Creatinine, Ser: 0.86 mg/dL (ref 0.50–1.35)
GFR calc Af Amer: >90 mL/min (ref >90)
GLUCOSE: 177 mg/dL — AB (ref 70–99)
POTASSIUM: 3.8 meq/L (ref 3.7–5.3)
Sodium: 140 mEq/L (ref 137–147)
Total Protein: 4.8 g/dL — ABNORMAL LOW (ref 6.0–8.3)

## 2014-04-28 LAB — URINALYSIS W MICROSCOPIC (NOT AT ARMC)
Bilirubin Urine: NEGATIVE
Glucose, UA: NEGATIVE mg/dL
Ketones, ur: NEGATIVE mg/dL
Leukocytes, UA: NEGATIVE
Nitrite: NEGATIVE
Protein, ur: NEGATIVE mg/dL
Specific Gravity, Urine: 1.017 (ref 1.005–1.030)
Urobilinogen, UA: 0.2 mg/dL (ref 0.0–1.0)
pH: 5.5 (ref 5.0–8.0)

## 2014-04-28 LAB — PREPARE FRESH FROZEN PLASMA
Unit division: 0
Unit division: 0

## 2014-04-28 LAB — PROTIME-INR
INR: 1.19 (ref 0.00–1.49)
Prothrombin Time: 15.2 seconds (ref 11.6–15.2)

## 2014-04-28 LAB — POCT I-STAT 7, (LYTES, BLD GAS, ICA,H+H)
Acid-Base Excess: 1 mmol/L (ref 0.0–2.0)
Acid-base deficit: 2 mmol/L (ref 0.0–2.0)
Bicarbonate: 23.7 mEq/L (ref 20.0–24.0)
Bicarbonate: 27.5 mEq/L — ABNORMAL HIGH (ref 20.0–24.0)
Calcium, Ion: 1.09 mmol/L — ABNORMAL LOW (ref 1.12–1.23)
Calcium, Ion: 1.12 mmol/L (ref 1.12–1.23)
HCT: 29 % — ABNORMAL LOW (ref 39.0–52.0)
HEMATOCRIT: 23 % — AB (ref 39.0–52.0)
HEMOGLOBIN: 7.8 g/dL — AB (ref 13.0–17.0)
Hemoglobin: 9.9 g/dL — ABNORMAL LOW (ref 13.0–17.0)
O2 Saturation: 100 %
O2 Saturation: 99 %
PH ART: 7.346 — AB (ref 7.350–7.450)
PO2 ART: 257 mmHg — AB (ref 80.0–100.0)
Potassium: 3.7 mEq/L (ref 3.7–5.3)
Potassium: 4.1 mEq/L (ref 3.7–5.3)
Sodium: 138 mEq/L (ref 137–147)
Sodium: 139 mEq/L (ref 137–147)
TCO2: 25 mmol/L (ref 0–100)
TCO2: 29 mmol/L (ref 0–100)
pCO2 arterial: 43.3 mmHg (ref 35.0–45.0)
pCO2 arterial: 50.8 mmHg — ABNORMAL HIGH (ref 35.0–45.0)
pH, Arterial: 7.341 — ABNORMAL LOW (ref 7.350–7.450)
pO2, Arterial: 167 mmHg — ABNORMAL HIGH (ref 80.0–100.0)

## 2014-04-28 LAB — PRO B NATRIURETIC PEPTIDE: Pro B Natriuretic peptide (BNP): 109.2 pg/mL (ref 0–125)

## 2014-04-28 LAB — PREPARE PLATELET PHERESIS: UNIT DIVISION: 0

## 2014-04-28 LAB — MRSA PCR SCREENING: MRSA by PCR: POSITIVE — AB

## 2014-04-28 LAB — TRIGLYCERIDES: Triglycerides: 101 mg/dL (ref <150)

## 2014-04-28 LAB — TRANSFUSION REACTION
DAT C3: NEGATIVE
Post RXN DAT IgG: NEGATIVE

## 2014-04-28 MED ORDER — ONDANSETRON HCL 4 MG/2ML IJ SOLN
INTRAMUSCULAR | Status: DC | PRN
  Administered 2014-04-27: 4 mg via INTRAVENOUS

## 2014-04-28 MED ORDER — INFLUENZA VAC SPLIT QUAD 0.5 ML IM SUSY
0.5000 mL | PREFILLED_SYRINGE | INTRAMUSCULAR | Status: AC | PRN
  Administered 2014-05-02: 0.5 mL via INTRAMUSCULAR

## 2014-04-28 MED ORDER — CETYLPYRIDINIUM CHLORIDE 0.05 % MT LIQD
7.0000 mL | Freq: Four times a day (QID) | OROMUCOSAL | Status: DC
  Administered 2014-04-28 – 2014-05-01 (×12): 7 mL via OROMUCOSAL

## 2014-04-28 MED ORDER — FENTANYL CITRATE 0.05 MG/ML IJ SOLN
50.0000 ug | INTRAMUSCULAR | Status: DC | PRN
  Administered 2014-04-28 – 2014-04-29 (×10): 100 ug via INTRAVENOUS
  Filled 2014-04-28 (×10): qty 2

## 2014-04-28 MED ORDER — HYDROMORPHONE HCL 1 MG/ML IJ SOLN: 0.5000 mg | INTRAMUSCULAR | Status: DC | PRN

## 2014-04-28 MED ORDER — MUPIROCIN 2 % EX OINT
1.0000 "application " | TOPICAL_OINTMENT | Freq: Two times a day (BID) | CUTANEOUS | Status: DC
  Administered 2014-04-28 – 2014-05-02 (×9): 1 via NASAL
  Filled 2014-04-28 (×2): qty 22

## 2014-04-28 MED ORDER — FENTANYL CITRATE 0.05 MG/ML IJ SOLN
INTRAMUSCULAR | Status: AC
  Filled 2014-04-28: qty 5

## 2014-04-28 MED ORDER — SODIUM CHLORIDE 0.9 % IV SOLN: INTRAVENOUS | Status: DC | PRN

## 2014-04-28 MED ORDER — SODIUM CHLORIDE 0.9 % IV SOLN
INTRAVENOUS | Status: DC | PRN
  Administered 2014-04-27: 23:00:00 via INTRAVENOUS

## 2014-04-28 MED ORDER — ONDANSETRON HCL 4 MG/2ML IJ SOLN: 4.0000 mg | Freq: Four times a day (QID) | INTRAMUSCULAR | Status: DC | PRN

## 2014-04-28 MED ORDER — NEOSTIGMINE METHYLSULFATE 10 MG/10ML IV SOLN
INTRAVENOUS | Status: DC | PRN
  Administered 2014-04-28: 4 mg via INTRAVENOUS

## 2014-04-28 MED ORDER — PROPOFOL 10 MG/ML IV BOLUS
INTRAVENOUS | Status: AC
  Filled 2014-04-28: qty 20

## 2014-04-28 MED ORDER — FAMOTIDINE IN NACL 20-0.9 MG/50ML-% IV SOLN
20.0000 mg | Freq: Once | INTRAVENOUS | Status: AC
  Administered 2014-04-28: 20 mg via INTRAVENOUS
  Filled 2014-04-28: qty 50

## 2014-04-28 MED ORDER — EPINEPHRINE HCL 0.1 MG/ML IJ SOSY
PREFILLED_SYRINGE | INTRAMUSCULAR | Status: DC | PRN
  Administered 2014-04-28 (×2): 0.1 mg via INTRAVENOUS

## 2014-04-28 MED ORDER — ONDANSETRON HCL 4 MG/2ML IJ SOLN: 4.0000 mg | Freq: Once | INTRAMUSCULAR | Status: AC | PRN

## 2014-04-28 MED ORDER — PANTOPRAZOLE SODIUM 40 MG IV SOLR
40.0000 mg | Freq: Every day | INTRAVENOUS | Status: DC
  Administered 2014-04-28 – 2014-04-30 (×3): 40 mg via INTRAVENOUS
  Filled 2014-04-28 (×5): qty 40

## 2014-04-28 MED ORDER — PROPOFOL 10 MG/ML IV EMUL
0.0000 ug/kg/min | INTRAVENOUS | Status: DC
  Administered 2014-04-28: 50 ug/kg/min via INTRAVENOUS
  Administered 2014-04-28: 25 ug/kg/min via INTRAVENOUS
  Administered 2014-04-28: 50 ug/kg/min via INTRAVENOUS
  Filled 2014-04-28 (×3): qty 100

## 2014-04-28 MED ORDER — OXYCODONE HCL 5 MG/5ML PO SOLN: 5.0000 mg | Freq: Once | ORAL | Status: DC | PRN

## 2014-04-28 MED ORDER — ONDANSETRON HCL 4 MG PO TABS: 4.0000 mg | ORAL_TABLET | Freq: Four times a day (QID) | ORAL | Status: DC | PRN

## 2014-04-28 MED ORDER — HYDROMORPHONE HCL 1 MG/ML IJ SOLN: 0.2500 mg | INTRAMUSCULAR | Status: DC | PRN

## 2014-04-28 MED ORDER — PHENYLEPHRINE HCL 10 MG/ML IJ SOLN
INTRAMUSCULAR | Status: DC | PRN
  Administered 2014-04-28 (×5): 80 ug via INTRAVENOUS

## 2014-04-28 MED ORDER — PHENOL 1.4 % MT LIQD: 1.0000 | OROMUCOSAL | Status: DC | PRN

## 2014-04-28 MED ORDER — OXYCODONE HCL 5 MG PO TABS: 5.0000 mg | ORAL_TABLET | Freq: Once | ORAL | Status: DC | PRN

## 2014-04-28 MED ORDER — CHLORHEXIDINE GLUCONATE CLOTH 2 % EX PADS
6.0000 | MEDICATED_PAD | Freq: Every day | CUTANEOUS | Status: AC
  Administered 2014-04-29 – 2014-05-02 (×4): 6 via TOPICAL

## 2014-04-28 MED ORDER — 0.9 % SODIUM CHLORIDE (POUR BTL) OPTIME
TOPICAL | Status: DC | PRN
  Administered 2014-04-27: 4000 mL

## 2014-04-28 MED ORDER — FENTANYL CITRATE 0.05 MG/ML IJ SOLN
100.0000 ug | INTRAMUSCULAR | Status: DC | PRN
  Administered 2014-04-28: 100 ug via INTRAVENOUS
  Filled 2014-04-28: qty 2

## 2014-04-28 MED ORDER — MIDAZOLAM HCL 2 MG/2ML IJ SOLN
INTRAMUSCULAR | Status: DC | PRN
  Administered 2014-04-28: 2 mg via INTRAVENOUS

## 2014-04-28 MED ORDER — PANTOPRAZOLE SODIUM 40 MG PO TBEC
40.0000 mg | DELAYED_RELEASE_TABLET | Freq: Every day | ORAL | Status: DC
  Administered 2014-05-01 – 2014-05-02 (×2): 40 mg via ORAL
  Filled 2014-04-28 (×3): qty 1

## 2014-04-28 MED ORDER — DIPHENHYDRAMINE HCL 50 MG/ML IJ SOLN
INTRAMUSCULAR | Status: DC | PRN
  Administered 2014-04-28 (×2): 12.5 mg via INTRAVENOUS

## 2014-04-28 MED ORDER — MIDAZOLAM HCL 2 MG/2ML IJ SOLN
INTRAMUSCULAR | Status: AC
  Filled 2014-04-28: qty 2

## 2014-04-28 MED ORDER — SODIUM CHLORIDE 0.9 % IV SOLN
INTRAVENOUS | Status: DC
  Administered 2014-04-28 – 2014-04-30 (×6): via INTRAVENOUS

## 2014-04-28 MED ORDER — CHLORHEXIDINE GLUCONATE 0.12 % MT SOLN
15.0000 mL | Freq: Two times a day (BID) | OROMUCOSAL | Status: DC
  Administered 2014-04-28 – 2014-04-30 (×6): 15 mL via OROMUCOSAL
  Filled 2014-04-28 (×9): qty 15

## 2014-04-28 MED ORDER — GLYCOPYRROLATE 0.2 MG/ML IJ SOLN
INTRAMUSCULAR | Status: DC | PRN
  Administered 2014-04-28: 0.6 mg via INTRAVENOUS

## 2014-04-28 MED ORDER — DEXAMETHASONE SODIUM PHOSPHATE 10 MG/ML IJ SOLN
INTRAMUSCULAR | Status: DC | PRN
  Administered 2014-04-28: 8 mg via INTRAVENOUS

## 2014-04-28 NOTE — Plan of Care (Signed)
Problem: Phase I Progression Outcomes Goal: Pain controlled with appropriate interventions Outcome: Progressing Goal: Tolerates activity with pain controlled Outcome: Progressing Goal: Abdomen soft & non-distended Outcome: Completed/Met Date Met:  04/28/14 Goal: Bowel Sounds present Outcome: Progressing Goal: Initial discharge plan identified Outcome: Progressing Goal: Voiding-avoid urinary catheter unless indicated Outcome: Not Met (add Reason) Urinary catheter placed in periop    Goal: Hemodynamically stable Outcome: Completed/Met Date Met:  04/28/14 Goal: NPO, sips with medications Outcome: Not Applicable Date Met:  54/98/26 Pt NPO at this time    Goal: No activity restrictions Outcome: Progressing

## 2014-04-28 NOTE — Progress Notes (Signed)
[facility]  Subjective  - Feels tired sore   Objective 127/79 78 98.8 F (37.1 C) (Axillary) 19 100%  Intake/Output Summary (Last 24 hours) at 04/28/14 0931 Last data filed at 04/28/14 0800  Gross per 24 hour  Intake 8350.85 ml  Output   2990 ml  Net 5360.85 ml   Abdomen soft Extremities minimal edema 2+ DP pulses  Assessment/Planning: S/p repair of IVC SCDs to encourage venous return OOB when practical ambulate  Nathan Sanchez E 04/28/2014 9:31 AM --  Laboratory Lab Results:  Recent Labs  04/27/14 2130  04/28/14 0004 04/28/14 0030  WBC 5.2  --   --  4.6  HGB 12.6*  < > 9.9* 11.9*  HCT 36.2*  < > 29.0* 34.2*  PLT 280  --   --  225  < > = values in this interval not displayed. BMET  Recent Labs  04/27/14 2130 04/27/14 2149  04/27/14 2236  04/28/14 0004 04/28/14 0030  NA 141 140  < > 140  < > 138 140  K 3.1* 3.0*  < > 3.9  < > 4.1 3.8  CL 104 104  --   --   --   --  105  CO2 24  --   --   --   --   --  22  GLUCOSE 123* 125*  < > 133*  --   --  177*  BUN 7 6  --   --   --   --  9  CREATININE 1.00 1.10  --   --   --   --  0.86  CALCIUM 7.9*  --   --   --   --   --  7.3*  < > = values in this interval not displayed.  COAG Lab Results  Component Value Date   INR 1.19 04/28/2014   INR 1.04 04/27/2014   No results found for: PTT

## 2014-04-28 NOTE — Progress Notes (Signed)
UR completed.  Zaivion Kundrat, RN BSN [facility] CCM Trauma/Neuro ICU Case Manager [phone]  
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
## 2014-04-28 NOTE — Transfer of Care (Signed)
Immediate Anesthesia Transfer of Care Note  Patient: Nathan BargesLonnie D Broski  Procedure(s) Performed: Procedure(s): EXPLORATORY LAPAROTOMY REAPIR OF VENA CAVA, REPAIR OF COLON, PLACEMENT OF WOUND VAC (N/A)  Patient Location: ICU  Anesthesia Type:General  Level of Consciousness: Patient remains intubated per anesthesia plan  Airway & Oxygen Therapy: Patient remains intubated per anesthesia plan and Patient placed on Ventilator (see vital sign flow sheet for setting)  Post-op Assessment: Report given to PACU RN and Post -op Vital signs reviewed and stable  Post vital signs: Reviewed and stable  Complications: No apparent anesthesia complications and Possible transfusion reaction.  Dr Corliss Skainssuei, Dr Trina Aorews,and blood bank aware.  Pt responding well to treatment.  BP stabilized and rash improving.  Labs sent to blood bank per SICU staff.  Paperwork completed.
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
## 2014-04-28 NOTE — Anesthesia Postprocedure Evaluation (Signed)
  Anesthesia Post-op Note  Patient: Nathan Sanchez  Procedure(s) Performed: Procedure(s): EXPLORATORY LAPAROTOMY REAPIR OF VENA CAVA, REPAIR OF COLON, PLACEMENT OF WOUND VAC (N/A)  Patient Location: SICU  Anesthesia Type:General  Level of Consciousness: sedated and Patient remains intubated per anesthesia plan  Airway and Oxygen Therapy: Patient remains intubated per anesthesia plan and Patient placed on Ventilator (see vital sign flow sheet for setting)  Post-op Pain: none  Post-op Assessment: Post-op Vital signs reviewed  Post-op Vital Signs: Reviewed  Last Vitals:  Filed Vitals:   04/28/14 0500  BP: 131/83  Pulse: 74  Temp:   Resp: 18    Complications: No apparent anesthesia complications and the pt was treated appropriately at the end of surgery as he had some type of allergic reaction.  May have been a reaction to blood or platelets and blood bank was notified.  Doing well this AM. Very stable.
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
## 2014-04-28 NOTE — Procedures (Signed)
Extubation Procedure Note  Patient Details:   Name: [name] DOB: 10/18/1960 MRN: [id number]   Airway Documentation:     Evaluation  O2 sats: stable throughout Complications: No apparent complications Patient did tolerate procedure well. Bilateral Breath Sounds: Clear, Diminished Suctioning: Oral Yes pt able to vocalize.  Pt extubated at this time per MD order. Pt tolerated well. Pt was able to breathe around deflated cuff. Pt placed on 4L [place]. Pt has adequate cough. No complications. No stridor noted. VS stable. RT will continue to monitor.   Loyal Jacobsonhompson, Kavish Lafitte [facility] 04/28/2014, 9:17 AM

## 2014-04-29 ENCOUNTER — Encounter (HOSPITAL_COMMUNITY): Payer: Self-pay | Admitting: Anesthesiology

## 2014-04-29 LAB — BASIC METABOLIC PANEL
Anion gap: 11 (ref 5–15)
BUN: 11 mg/dL (ref 6–23)
CO2: 22 mEq/L (ref 19–32)
Calcium: 8.1 mg/dL — ABNORMAL LOW (ref 8.4–10.5)
Chloride: 105 mEq/L (ref 96–112)
Creatinine, Ser: 0.82 mg/dL (ref 0.50–1.35)
Glucose, Bld: 123 mg/dL — ABNORMAL HIGH (ref 70–99)
POTASSIUM: 4.1 meq/L (ref 3.7–5.3)
SODIUM: 138 meq/L (ref 137–147)

## 2014-04-29 LAB — CBC
HCT: 31.9 % — ABNORMAL LOW (ref 39.0–52.0)
Hemoglobin: 11.3 g/dL — ABNORMAL LOW (ref 13.0–17.0)
MCH: 33 pg (ref 26.0–34.0)
MCHC: 35.4 g/dL (ref 30.0–36.0)
MCV: 93.3 fL (ref 78.0–100.0)
PLATELETS: 239 10*3/uL (ref 150–400)
RBC: 3.42 MIL/uL — ABNORMAL LOW (ref 4.22–5.81)
RDW: 15.6 % — AB (ref 11.5–15.5)
WBC: 8.4 10*3/uL (ref 4.0–10.5)

## 2014-04-29 MED ORDER — HYDROMORPHONE 0.3 MG/ML IV SOLN
INTRAVENOUS | Status: DC
  Administered 2014-04-29: 2.3 mg via INTRAVENOUS
  Administered 2014-04-29: 11:00:00 via INTRAVENOUS
  Administered 2014-04-29: 2.7 mg via INTRAVENOUS
  Administered 2014-04-29: 21:00:00 via INTRAVENOUS
  Administered 2014-04-29: 2.7 mg via INTRAVENOUS
  Administered 2014-04-29: 2.1 mg via INTRAVENOUS
  Administered 2014-04-30: 2.84 mg via INTRAVENOUS
  Administered 2014-04-30: 4.8 mg via INTRAVENOUS
  Administered 2014-04-30: 3.9 mg via INTRAVENOUS
  Administered 2014-04-30: 3.3 mg via INTRAVENOUS
  Administered 2014-04-30: 4.2 mg via INTRAVENOUS
  Administered 2014-04-30 (×2): 2.7 mg via INTRAVENOUS
  Administered 2014-05-01: via INTRAVENOUS
  Administered 2014-05-01: 3.6 mg via INTRAVENOUS
  Filled 2014-04-29 (×5): qty 25

## 2014-04-29 MED ORDER — SODIUM CHLORIDE 0.9 % IJ SOLN: 9.0000 mL | INTRAMUSCULAR | Status: DC | PRN

## 2014-04-29 MED ORDER — NALOXONE HCL 0.4 MG/ML IJ SOLN: 0.4000 mg | INTRAMUSCULAR | Status: DC | PRN

## 2014-04-29 MED ORDER — DIPHENHYDRAMINE HCL 12.5 MG/5ML PO ELIX
12.5000 mg | ORAL_SOLUTION | Freq: Four times a day (QID) | ORAL | Status: DC | PRN
  Filled 2014-04-29: qty 5

## 2014-04-29 MED ORDER — ONDANSETRON HCL 4 MG/2ML IJ SOLN: 4.0000 mg | Freq: Four times a day (QID) | INTRAMUSCULAR | Status: DC | PRN

## 2014-04-29 MED ORDER — DIPHENHYDRAMINE HCL 50 MG/ML IJ SOLN: 12.5000 mg | Freq: Four times a day (QID) | INTRAMUSCULAR | Status: DC | PRN

## 2014-04-29 NOTE — Plan of Care (Signed)
Problem: Phase I Progression Outcomes Goal: Pain controlled with appropriate interventions Outcome: Completed/Met Date Met:  04/29/14  Comments:  Full dose Dilaudid PCA
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
## 2014-04-29 NOTE — Plan of Care (Signed)
Problem: Phase I Progression Outcomes Goal: Voiding-avoid urinary catheter unless indicated Outcome: Completed/Met Date Met:  04/29/14     

## 2014-04-30 LAB — CBC WITH DIFFERENTIAL/PLATELET
BASOS ABS: 0 10*3/uL (ref 0.0–0.1)
BASOS PCT: 0 % (ref 0–1)
Eosinophils Absolute: 0 10*3/uL (ref 0.0–0.7)
Eosinophils Relative: 0 % (ref 0–5)
HCT: 31.2 % — ABNORMAL LOW (ref 39.0–52.0)
Hemoglobin: 10.6 g/dL — ABNORMAL LOW (ref 13.0–17.0)
Lymphocytes Relative: 13 % (ref 12–46)
Lymphs Abs: 0.9 10*3/uL (ref 0.7–4.0)
MCH: 32.4 pg (ref 26.0–34.0)
MCHC: 34 g/dL (ref 30.0–36.0)
MCV: 95.4 fL (ref 78.0–100.0)
Monocytes Absolute: 0.6 10*3/uL (ref 0.1–1.0)
Monocytes Relative: 8 % (ref 3–12)
NEUTROS ABS: 5.4 10*3/uL (ref 1.7–7.7)
Neutrophils Relative %: 79 % — ABNORMAL HIGH (ref 43–77)
PLATELETS: 225 10*3/uL (ref 150–400)
RBC: 3.27 MIL/uL — ABNORMAL LOW (ref 4.22–5.81)
RDW: 14.8 % (ref 11.5–15.5)
WBC: 6.9 10*3/uL (ref 4.0–10.5)

## 2014-04-30 LAB — BASIC METABOLIC PANEL
ANION GAP: 11 (ref 5–15)
BUN: 11 mg/dL (ref 6–23)
CALCIUM: 8.5 mg/dL (ref 8.4–10.5)
CO2: 22 meq/L (ref 19–32)
Chloride: 99 mEq/L (ref 96–112)
Creatinine, Ser: 0.78 mg/dL (ref 0.50–1.35)
GFR calc non Af Amer: >90 mL/min (ref >90)
Glucose, Bld: 100 mg/dL — ABNORMAL HIGH (ref 70–99)
Potassium: 4 mEq/L (ref 3.7–5.3)
SODIUM: 132 meq/L — AB (ref 137–147)

## 2014-04-30 MED ORDER — POTASSIUM CHLORIDE 2 MEQ/ML IV SOLN
INTRAVENOUS | Status: DC
  Administered 2014-04-30 – 2014-05-01 (×2): via INTRAVENOUS
  Filled 2014-04-30 (×3): qty 1000
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
## 2014-05-01 DIAGNOSIS — S36509A Unspecified injury of unspecified part of colon, initial encounter: Secondary | ICD-10-CM | POA: Diagnosis present

## 2014-05-01 DIAGNOSIS — T8092XA Unspecified transfusion reaction, initial encounter: Secondary | ICD-10-CM | POA: Diagnosis not present

## 2014-05-01 DIAGNOSIS — D62 Acute posthemorrhagic anemia: Secondary | ICD-10-CM | POA: Diagnosis not present

## 2014-05-01 DIAGNOSIS — E871 Hypo-osmolality and hyponatremia: Secondary | ICD-10-CM | POA: Diagnosis not present

## 2014-05-01 DIAGNOSIS — S31139A Puncture wound of abdominal wall without foreign body, unspecified quadrant without penetration into peritoneal cavity, initial encounter: Secondary | ICD-10-CM

## 2014-05-01 DIAGNOSIS — S36899A Unspecified injury of other intra-abdominal organs, initial encounter: Secondary | ICD-10-CM | POA: Diagnosis present

## 2014-05-01 DIAGNOSIS — W3400XA Accidental discharge from unspecified firearms or gun, initial encounter: Secondary | ICD-10-CM

## 2014-05-01 LAB — TYPE AND SCREEN
ABO/RH(D): A POS
Antibody Screen: NEGATIVE
UNIT DIVISION: 0
UNIT DIVISION: 0
UNIT DIVISION: 0
UNIT DIVISION: 0
UNIT DIVISION: 0
UNIT DIVISION: 0
Unit division: 0
Unit division: 0
Unit division: 0
Unit division: 0

## 2014-05-01 LAB — BASIC METABOLIC PANEL
Anion gap: 10 (ref 5–15)
BUN: 9 mg/dL (ref 6–23)
CHLORIDE: 100 meq/L (ref 96–112)
CO2: 25 mEq/L (ref 19–32)
Calcium: 8.1 mg/dL — ABNORMAL LOW (ref 8.4–10.5)
Creatinine, Ser: 0.72 mg/dL (ref 0.50–1.35)
Glucose, Bld: 118 mg/dL — ABNORMAL HIGH (ref 70–99)
POTASSIUM: 3.7 meq/L (ref 3.7–5.3)
SODIUM: 135 meq/L — AB (ref 137–147)

## 2014-05-01 LAB — CBC
HEMATOCRIT: 26.6 % — AB (ref 39.0–52.0)
Hemoglobin: 9.3 g/dL — ABNORMAL LOW (ref 13.0–17.0)
MCH: 33 pg (ref 26.0–34.0)
MCHC: 35 g/dL (ref 30.0–36.0)
MCV: 94.3 fL (ref 78.0–100.0)
Platelets: 249 10*3/uL (ref 150–400)
RBC: 2.82 MIL/uL — ABNORMAL LOW (ref 4.22–5.81)
RDW: 14.1 % (ref 11.5–15.5)
WBC: 5.7 10*3/uL (ref 4.0–10.5)

## 2014-05-01 MED ORDER — LORAZEPAM 0.5 MG PO TABS
0.5000 mg | ORAL_TABLET | Freq: Every evening | ORAL | Status: DC | PRN
  Administered 2014-05-01: 0.5 mg via ORAL
  Filled 2014-05-01: qty 1

## 2014-05-01 MED ORDER — ENOXAPARIN SODIUM 40 MG/0.4ML ~~LOC~~ SOLN
40.0000 mg | SUBCUTANEOUS | Status: DC
  Administered 2014-05-01 – 2014-05-02 (×2): 40 mg via SUBCUTANEOUS
  Filled 2014-05-01 (×2): qty 0.4

## 2014-05-01 MED ORDER — POLYETHYLENE GLYCOL 3350 17 G PO PACK
17.0000 g | PACK | Freq: Every day | ORAL | Status: DC
  Administered 2014-05-01: 17 g via ORAL
  Filled 2014-05-01 (×2): qty 1

## 2014-05-01 MED ORDER — BOOST / RESOURCE BREEZE PO LIQD
1.0000 | Freq: Three times a day (TID) | ORAL | Status: DC
  Administered 2014-05-01 – 2014-05-02 (×4): 1 via ORAL

## 2014-05-01 MED ORDER — NAPROXEN 500 MG PO TABS
500.0000 mg | ORAL_TABLET | Freq: Two times a day (BID) | ORAL | Status: DC
  Administered 2014-05-01 – 2014-05-02 (×3): 500 mg via ORAL
  Filled 2014-05-01: qty 1
  Filled 2014-05-01: qty 2
  Filled 2014-05-01 (×4): qty 1

## 2014-05-01 MED ORDER — DOCUSATE SODIUM 100 MG PO CAPS
100.0000 mg | ORAL_CAPSULE | Freq: Two times a day (BID) | ORAL | Status: DC
  Administered 2014-05-01 – 2014-05-02 (×3): 100 mg via ORAL
  Filled 2014-05-01 (×3): qty 1

## 2014-05-01 MED ORDER — OXYCODONE HCL 5 MG PO TABS
5.0000 mg | ORAL_TABLET | ORAL | Status: DC | PRN
  Administered 2014-05-01 – 2014-05-02 (×6): 15 mg via ORAL
  Filled 2014-05-01 (×6): qty 3

## 2014-05-01 MED ORDER — HYDROMORPHONE HCL 1 MG/ML IJ SOLN
0.5000 mg | INTRAMUSCULAR | Status: DC | PRN
  Administered 2014-05-01 – 2014-05-02 (×3): 0.5 mg via INTRAVENOUS
  Filled 2014-05-01 (×3): qty 1

## 2014-05-01 NOTE — Consult Note (Signed)
WOC wound consult note Reason for Consult: Requested to change abd Vac dressing Wound type: Full thickness post-op wound Measurement: 19X3.5X1cm Wound bed: 100% beefy red Drainage (amount, consistency, odor) Small amt red drainage in cannister, no odor Periwound: Intact skin syurrounding Dressing procedure/placement/frequency: Applied one piece black sponge to 125mm cont suction.  Pt tolerated with minimal discomfort after meds given.  Bedside nurse can change Q M/W/F Please re-consult if further assistance is needed.  Thank-you,  Cammie Mcgeeawn Menaal Russum MSN, RN, CWOCN, [place]CN-AP, CNS [phone]

## 2014-05-01 NOTE — Plan of Care (Signed)
Problem: Phase I Progression Outcomes Goal: Bowel Sounds present Outcome: Completed/Met Date Met:  05/01/14  Problem: Phase II Progression Outcomes Goal: Progress activity as tolerated unless otherwise ordered Outcome: Completed/Met Date Met:  05/01/14
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
## 2014-05-01 NOTE — Progress Notes (Addendum)
UR completed.  HHRN arranged with [facility] per patient choice. [facility].  [facility] fax[facility] to main number for approval 1445pm. Medicare IM given to patient and explained by me today.  Carlyle LipaMichelle Yazhini Mcaulay, RN BSN MHA CCM Trauma/Neuro ICU Case Manager [phone]   **ADDENDUM:  [facility] at 1650pm stating that patient would be receiving [facility] on Saturday 05/02/2014.
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
## 2014-05-02 LAB — CBC
HCT: 27.3 % — ABNORMAL LOW (ref 39.0–52.0)
Hemoglobin: 9.6 g/dL — ABNORMAL LOW (ref 13.0–17.0)
MCH: 33.4 pg (ref 26.0–34.0)
MCHC: 35.2 g/dL (ref 30.0–36.0)
MCV: 95.1 fL (ref 78.0–100.0)
PLATELETS: 300 10*3/uL (ref 150–400)
RBC: 2.87 MIL/uL — ABNORMAL LOW (ref 4.22–5.81)
RDW: 13.8 % (ref 11.5–15.5)
WBC: 5.5 10*3/uL (ref 4.0–10.5)

## 2014-05-02 MED ORDER — OXYCODONE-ACETAMINOPHEN 10-325 MG PO TABS: 1.0000 | ORAL_TABLET | ORAL | Status: DC | PRN

## 2014-05-02 MED ORDER — POLYETHYLENE GLYCOL 3350 17 G PO PACK
17.0000 g | PACK | Freq: Once | ORAL | Status: AC
  Administered 2014-05-02: 17 g via ORAL

## 2014-05-02 MED ORDER — NAPROXEN 500 MG PO TABS: 500.0000 mg | ORAL_TABLET | Freq: Two times a day (BID) | ORAL | Status: DC
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
## 2014-05-02 NOTE — Progress Notes (Signed)
Discharge home. Home discharge instruction given to patient. Home vac connected to present dressing. Home health needs addressed to case manager.
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
## 2014-05-02 NOTE — Discharge Instructions (Signed)
No driving while taking oxycodone.  No lifting more than 5 pounds for 6 weeks.  Leave abdominal dressing in place. A nurse will come to your house to change it. I suggest taking a shower just before they come to change the dressing (just disconnect the pump -- if you want to take the entire dressing off and shower with the wound open that's ok. No soaking in a bath however)
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
## 2014-05-05 LAB — BLOOD PRODUCT ORDER (VERBAL) VERIFICATION

## 2014-05-11 ENCOUNTER — Telehealth: Payer: Self-pay

## 2014-05-11 NOTE — Telephone Encounter (Signed)
Refill percocet 10/325mg  take 1 tab PO TID as needed for pain.  #40 pills , no refills  Has f/u appt on 05/20/14

## 2014-05-13 ENCOUNTER — Encounter (HOSPITAL_COMMUNITY): Payer: Self-pay | Admitting: Emergency Medicine

## 2014-05-27 ENCOUNTER — Telehealth (HOSPITAL_COMMUNITY): Payer: Self-pay

## 2014-05-27 NOTE — Telephone Encounter (Signed)
Rescheduled for 3pm on 06/03/14

## 2014-06-26 ENCOUNTER — Emergency Department (HOSPITAL_COMMUNITY): Payer: Commercial Managed Care - HMO

## 2014-06-26 ENCOUNTER — Encounter (HOSPITAL_COMMUNITY): Payer: Self-pay | Admitting: *Deleted

## 2014-06-26 ENCOUNTER — Emergency Department (HOSPITAL_COMMUNITY)
Admission: EM | Admit: 2014-06-26 | Discharge: 2014-06-26 | Disposition: A | Discharge status: Home / Self Care | Payer: Commercial Managed Care - HMO | Attending: Emergency Medicine | Admitting: Emergency Medicine

## 2014-06-26 DIAGNOSIS — S62320A Displaced fracture of shaft of second metacarpal bone, right hand, initial encounter for closed fracture: Secondary | ICD-10-CM | POA: Diagnosis not present

## 2014-06-26 DIAGNOSIS — Y998 Other external cause status: Secondary | ICD-10-CM | POA: Diagnosis not present

## 2014-06-26 DIAGNOSIS — W228XXA Striking against or struck by other objects, initial encounter: Secondary | ICD-10-CM | POA: Insufficient documentation

## 2014-06-26 DIAGNOSIS — S62341A Nondisplaced fracture of base of second metacarpal bone. left hand, initial encounter for closed fracture: Secondary | ICD-10-CM | POA: Diagnosis not present

## 2014-06-26 DIAGNOSIS — F419 Anxiety disorder, unspecified: Secondary | ICD-10-CM | POA: Diagnosis not present

## 2014-06-26 DIAGNOSIS — Z8719 Personal history of other diseases of the digestive system: Secondary | ICD-10-CM | POA: Diagnosis not present

## 2014-06-26 DIAGNOSIS — S62326A Displaced fracture of shaft of fifth metacarpal bone, right hand, initial encounter for closed fracture: Secondary | ICD-10-CM | POA: Diagnosis not present

## 2014-06-26 DIAGNOSIS — S62344A Nondisplaced fracture of base of fourth metacarpal bone, right hand, initial encounter for closed fracture: Secondary | ICD-10-CM

## 2014-06-26 DIAGNOSIS — Z87828 Personal history of other (healed) physical injury and trauma: Secondary | ICD-10-CM | POA: Insufficient documentation

## 2014-06-26 DIAGNOSIS — S62321A Displaced fracture of shaft of second metacarpal bone, left hand, initial encounter for closed fracture: Secondary | ICD-10-CM | POA: Diagnosis not present

## 2014-06-26 DIAGNOSIS — T1490XA Injury, unspecified, initial encounter: Secondary | ICD-10-CM

## 2014-06-26 DIAGNOSIS — Z791 Long term (current) use of non-steroidal anti-inflammatories (NSAID): Secondary | ICD-10-CM | POA: Diagnosis not present

## 2014-06-26 DIAGNOSIS — S62324A Displaced fracture of shaft of fourth metacarpal bone, right hand, initial encounter for closed fracture: Secondary | ICD-10-CM | POA: Diagnosis not present

## 2014-06-26 DIAGNOSIS — Y9389 Activity, other specified: Secondary | ICD-10-CM | POA: Diagnosis not present

## 2014-06-26 DIAGNOSIS — Z79899 Other long term (current) drug therapy: Secondary | ICD-10-CM | POA: Diagnosis not present

## 2014-06-26 DIAGNOSIS — Y9289 Other specified places as the place of occurrence of the external cause: Secondary | ICD-10-CM | POA: Insufficient documentation

## 2014-06-26 DIAGNOSIS — F329 Major depressive disorder, single episode, unspecified: Secondary | ICD-10-CM | POA: Insufficient documentation

## 2014-06-26 DIAGNOSIS — S62340A Nondisplaced fracture of base of second metacarpal bone, right hand, initial encounter for closed fracture: Secondary | ICD-10-CM | POA: Diagnosis not present

## 2014-06-26 DIAGNOSIS — S6991XA Unspecified injury of right wrist, hand and finger(s), initial encounter: Secondary | ICD-10-CM | POA: Diagnosis present

## 2014-06-26 HISTORY — DX: Accidental discharge from unspecified firearms or gun, initial encounter: W34.00XA

## 2014-06-26 HISTORY — DX: Unspecified firearm discharge, undetermined intent, initial encounter: Y24.9XXA

## 2014-06-26 MED ORDER — OXYCODONE-ACETAMINOPHEN 5-325 MG PO TABS: 1.0000 | ORAL_TABLET | ORAL | Status: DC | PRN

## 2014-06-26 MED ORDER — OXYCODONE-ACETAMINOPHEN 5-325 MG PO TABS
1.0000 | ORAL_TABLET | Freq: Once | ORAL | Status: AC
  Administered 2014-06-26: 1 via ORAL
  Filled 2014-06-26: qty 1

## 2014-06-26 NOTE — ED Notes (Signed)
Pt alert & oriented x4, stable gait. Patient given discharge instructions, paperwork & prescription(s). Patient  instructed to stop at the registration desk to finish any additional paperwork. Patient verbalized understanding. Pt left department w/ no further questions. 
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
## 2014-06-26 NOTE — Discharge Instructions (Signed)
Wear splint at all times until you see the orthopedic physician. Apply ice and keep your hand elevated as much as possible.  Hand Fracture, Metacarpals Fractures of metacarpals are breaks in the bones of the hand. They extend from the knuckles to the wrist. These bones can undergo many types of fractures. There are different ways of treating these fractures, all of which may be correct. TREATMENT  Hand fractures can be treated with:   Non-reduction - The fracture is casted without changing the positions of the fracture (bone pieces) involved. This fracture is usually left in a cast for 4 to 6 weeks or as your caregiver thinks necessary.  Closed reduction - The bones are moved back into position without surgery and then casted.  ORIF (open reduction and internal fixation) - The fracture site is opened and the bone pieces are fixed into place with some type of hardware, such as screws, etc. They are then casted. Your caregiver will discuss the type of fracture you have and the treatment that should be best for that problem. If surgery is chosen, let your caregivers know about the following.  LET YOUR CAREGIVERS KNOW ABOUT:  Allergies.  Medications you are taking, including herbs, eye drops, over the counter medications, and creams.  Use of steroids (by mouth or creams).  Previous problems with anesthetics or novocaine.  Possibility of pregnancy.  History of blood clots (thrombophlebitis).  History of bleeding or blood problems.  Previous surgeries.  Other health problems. AFTER THE PROCEDURE After surgery, you will be taken to the recovery area where a nurse will watch and check your progress. Once you are awake, stable, and taking fluids well, barring other problems, you'll be allowed to go home. Once home, an ice pack applied to your operative site may help with pain and keep the swelling down. HOME CARE INSTRUCTIONS   Follow your caregiver's instructions as to activities,  exercises, physical therapy, and driving a car.  Daily exercise is helpful for keeping range of motion and strength. Exercise as instructed.  To lessen swelling, keep the injured hand elevated above the level of your heart as much as possible.  Apply ice to the injury for 15-20 minutes each hour while awake for the first 2 days. Put the ice in a plastic bag and place a thin towel between the bag of ice and your cast.  Move the fingers of your casted hand several times a day.  If a plaster or fiberglass cast was applied:  Do not try to scratch the skin under the cast using a sharp or pointed object.  Check the skin around the cast every day. You may put lotion on red or sore areas.  Keep your cast dry. Your cast can be protected during bathing with a plastic bag. Do not put your cast into the water.  If a plaster splint was applied:  Wear your splint for as long as directed by your caregiver or until seen again.  Do not get your splint wet. Protect it during bathing with a plastic bag.  You may loosen the elastic bandage around the splint if your fingers start to get numb, tingle, get cold or turn blue.  Do not put pressure on your cast or splint; this may cause it to break. Especially, do not lean plaster casts on hard surfaces for 24 hours after application.  Take medications as directed by your caregiver.  Only take over-the-counter or prescription medicines for pain, discomfort, or fever as directed by  your caregiver.  Follow-up as provided by your caregiver. This is very important in order to avoid permanent injury or disability and chronic pain. SEEK MEDICAL CARE IF:   Increased bleeding (more than a small spot) from beneath your cast or splint if there is beneath the cast as with an open reduction.  Redness, swelling, or increasing pain in the wound or from beneath your cast or splint.  Pus coming from wound or from beneath your cast or splint.  An unexplained oral  temperature above 102 F (38.9 C) develops, or as your caregiver suggests.  A foul smell coming from the wound or dressing or from beneath your cast or splint.  You have a problem moving any of your fingers. SEEK IMMEDIATE MEDICAL CARE IF:   You develop a rash  You have difficulty breathing  You have any allergy problems If you do not have a window in your cast for observing the wound, a discharge or minor bleeding may show up as a stain on the outside of your cast. Report these findings to your caregiver. MAKE SURE YOU:   Understand these instructions.  Will watch your condition.  Will get help right away if you are not doing well or get worse. Document Released: 06/05/2005 Document Revised: 08/28/2011 Document Reviewed: 01/23/2008 [facility] Patient Information 2015 [place], [place]. This information is not intended to replace advice given to you by your health care provider. Make sure you discuss any questions you have with your health care provider.  Cast or Splint Care Casts and splints support injured limbs and keep bones from moving while they heal. It is important to care for your cast or splint at home.  HOME CARE INSTRUCTIONS  Keep the cast or splint uncovered during the drying period. It can take 24 to 48 hours to dry if it is made of plaster. A fiberglass cast will dry in less than 1 hour.  Do not rest the cast on anything harder than a pillow for the first 24 hours.  Do not put weight on your injured limb or apply pressure to the cast until your health care provider gives you permission.  Keep the cast or splint dry. Wet casts or splints can lose their shape and may not support the limb as well. A wet cast that has lost its shape can also create harmful pressure on your skin when it dries. Also, wet skin can become infected.  Cover the cast or splint with a plastic bag when bathing or when out in the rain or snow. If the cast is on the trunk of the body, take sponge  baths until the cast is removed.  If your cast does become wet, dry it with a towel or a blow dryer on the cool setting only.  Keep your cast or splint clean. Soiled casts may be wiped with a moistened cloth.  Do not place any hard or soft foreign objects under your cast or splint, such as cotton, toilet paper, lotion, or powder.  Do not try to scratch the skin under the cast with any object. The object could get stuck inside the cast. Also, scratching could lead to an infection. If itching is a problem, use a blow dryer on a cool setting to relieve discomfort.  Do not trim or cut your cast or remove padding from inside of it.  Exercise all joints next to the injury that are not immobilized by the cast or splint. For example, if you have a long leg  cast, exercise the hip joint and toes. If you have an arm cast or splint, exercise the shoulder, elbow, thumb, and fingers.  Elevate your injured arm or leg on 1 or 2 pillows for the first 1 to 3 days to decrease swelling and pain.It is best if you can comfortably elevate your cast so it is higher than your heart. SEEK MEDICAL CARE IF:   Your cast or splint cracks.  Your cast or splint is too tight or too loose.  You have unbearable itching inside the cast.  Your cast becomes wet or develops a soft spot or area.  You have a bad smell coming from inside your cast.  You get an object stuck under your cast.  Your skin around the cast becomes red or raw.  You have new pain or worsening pain after the cast has been applied. SEEK IMMEDIATE MEDICAL CARE IF:   You have fluid leaking through the cast.  You are unable to move your fingers or toes.  You have discolored (blue or white), cool, painful, or very swollen fingers or toes beyond the cast.  You have tingling or numbness around the injured area.  You have severe pain or pressure under the cast.  You have any difficulty with your breathing or have shortness of breath.  You have  chest pain. Document Released: 06/02/2000 Document Revised: 03/26/2013 Document Reviewed: 12/12/2012 [facility] Patient Information 2015 [place], [place]. This information is not intended to replace advice given to you by your health care provider. Make sure you discuss any questions you have with your health care provider.  Acetaminophen; Oxycodone tablets What is this medicine? ACETAMINOPHEN; OXYCODONE (a set a MEE noe fen; ox i KOE done) is a pain reliever. It is used to treat mild to moderate pain. This medicine may be used for other purposes; ask your health care provider or pharmacist if you have questions. COMMON BRAND NAME(S): Endocet, Magnacet, Narvox, Percocet, Perloxx, Primalev, Primlev, Roxicet, Xolox What should I tell my health care provider before I take this medicine? They need to know if you have any of these conditions: -brain tumor -Crohn's disease, inflammatory bowel disease, or ulcerative colitis -drug abuse or addiction -head injury -heart or circulation problems -if you often drink alcohol -kidney disease or problems going to the bathroom -liver disease -lung disease, asthma, or breathing problems -an unusual or allergic reaction to acetaminophen, oxycodone, other opioid analgesics, other medicines, foods, dyes, or preservatives -pregnant or trying to get pregnant -breast-feeding How should I use this medicine? Take this medicine by mouth with a full glass of water. Follow the directions on the prescription label. Take your medicine at regular intervals. Do not take your medicine more often than directed. Talk to your pediatrician regarding the use of this medicine in children. Special care may be needed. Patients over 53 years old may have a stronger reaction and need a smaller dose. Overdosage: If you think you have taken too much of this medicine contact a poison control center or emergency room at once. NOTE: This medicine is only for you. Do not share this  medicine with others. What if I miss a dose? If you miss a dose, take it as soon as you can. If it is almost time for your next dose, take only that dose. Do not take double or extra doses. What may interact with this medicine? -alcohol -antihistamines -barbiturates like amobarbital, butalbital, butabarbital, methohexital, pentobarbital, phenobarbital, thiopental, and secobarbital -benztropine -drugs for bladder problems like solifenacin, trospium, oxybutynin,  tolterodine, hyoscyamine, and methscopolamine -drugs for breathing problems like ipratropium and tiotropium -drugs for certain stomach or intestine problems like propantheline, homatropine methylbromide, glycopyrrolate, atropine, belladonna, and dicyclomine -general anesthetics like etomidate, ketamine, nitrous oxide, propofol, desflurane, enflurane, halothane, isoflurane, and sevoflurane -medicines for depression, anxiety, or psychotic disturbances -medicines for sleep -muscle relaxants -naltrexone -narcotic medicines (opiates) for pain -phenothiazines like perphenazine, thioridazine, chlorpromazine, mesoridazine, fluphenazine, prochlorperazine, promazine, and trifluoperazine -scopolamine -tramadol -trihexyphenidyl This list may not describe all possible interactions. Give your health care provider a list of all the medicines, herbs, non-prescription drugs, or dietary supplements you use. Also tell them if you smoke, drink alcohol, or use illegal drugs. Some items may interact with your medicine. What should I watch for while using this medicine? Tell your doctor or health care professional if your pain does not go away, if it gets worse, or if you have new or a different type of pain. You may develop tolerance to the medicine. Tolerance means that you will need a higher dose of the medication for pain relief. Tolerance is normal and is expected if you take this medicine for a long time. Do not suddenly stop taking your medicine  because you may develop a severe reaction. Your body becomes used to the medicine. This does NOT mean you are addicted. Addiction is a behavior related to getting and using a drug for a non-medical reason. If you have pain, you have a medical reason to take pain medicine. Your doctor will tell you how much medicine to take. If your doctor wants you to stop the medicine, the dose will be slowly lowered over time to avoid any side effects. You may get drowsy or dizzy. Do not drive, use machinery, or do anything that needs mental alertness until you know how this medicine affects you. Do not stand or sit up quickly, especially if you are an older patient. This reduces the risk of dizzy or fainting spells. Alcohol may interfere with the effect of this medicine. Avoid alcoholic drinks. There are different types of narcotic medicines (opiates) for pain. If you take more than one type at the same time, you may have more side effects. Give your health care provider a list of all medicines you use. Your doctor will tell you how much medicine to take. Do not take more medicine than directed. Call emergency for help if you have problems breathing. The medicine will cause constipation. Try to have a bowel movement at least every 2 to 3 days. If you do not have a bowel movement for 3 days, call your doctor or health care professional. Do not take Tylenol (acetaminophen) or medicines that have acetaminophen with this medicine. Too much acetaminophen can be very dangerous. Many nonprescription medicines contain acetaminophen. Always read the labels carefully to avoid taking more acetaminophen. What side effects may I notice from receiving this medicine? Side effects that you should report to your doctor or health care professional as soon as possible: -allergic reactions like skin rash, itching or hives, swelling of the face, lips, or tongue -breathing difficulties, wheezing -confusion -light headedness or fainting  spells -severe stomach pain -unusually weak or tired -yellowing of the skin or the whites of the eyes Side effects that usually do not require medical attention (report to your doctor or health care professional if they continue or are bothersome): -dizziness -drowsiness -nausea -vomiting This list may not describe all possible side effects. Call your doctor for medical advice about side effects. You may report side effects  to FDA at 1-800-FDA-1088. Where should I keep my medicine? Keep out of the reach of children. This medicine can be abused. Keep your medicine in a safe place to protect it from theft. Do not share this medicine with anyone. Selling or giving away this medicine is dangerous and against the law. Store at room temperature between 20 and 25 degrees C (68 and 77 degrees F). Keep container tightly closed. Protect from light. This medicine may cause accidental overdose and death if it is taken by other adults, children, or pets. Flush any unused medicine down the toilet to reduce the chance of harm. Do not use the medicine after the expiration date. NOTE: This sheet is a summary. It may not cover all possible information. If you have questions about this medicine, talk to your doctor, pharmacist, or health care provider.  2015, [employer]. (2013-01-27 13:17:35)
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
## 2014-06-29 MED FILL — Oxycodone w/ Acetaminophen Tab 5-325 MG: ORAL | Qty: 6 | Status: AC

## 2014-07-07 DIAGNOSIS — S62329A Displaced fracture of shaft of unspecified metacarpal bone, initial encounter for closed fracture: Secondary | ICD-10-CM | POA: Diagnosis not present

## 2014-07-07 DIAGNOSIS — S62319A Displaced fracture of base of unspecified metacarpal bone, initial encounter for closed fracture: Secondary | ICD-10-CM | POA: Diagnosis not present

## 2014-07-07 DIAGNOSIS — F17200 Nicotine dependence, unspecified, uncomplicated: Secondary | ICD-10-CM | POA: Diagnosis not present

## 2014-07-15 DIAGNOSIS — S62356A Nondisplaced fracture of shaft of fifth metacarpal bone, right hand, initial encounter for closed fracture: Secondary | ICD-10-CM | POA: Diagnosis not present

## 2014-07-15 DIAGNOSIS — S62324A Displaced fracture of shaft of fourth metacarpal bone, right hand, initial encounter for closed fracture: Secondary | ICD-10-CM | POA: Diagnosis not present

## 2014-08-05 DIAGNOSIS — S62356D Nondisplaced fracture of shaft of fifth metacarpal bone, right hand, subsequent encounter for fracture with routine healing: Secondary | ICD-10-CM | POA: Diagnosis not present

## 2014-08-05 DIAGNOSIS — S62324D Displaced fracture of shaft of fourth metacarpal bone, right hand, subsequent encounter for fracture with routine healing: Secondary | ICD-10-CM | POA: Diagnosis not present

## 2014-08-24 DIAGNOSIS — F17200 Nicotine dependence, unspecified, uncomplicated: Secondary | ICD-10-CM | POA: Diagnosis not present

## 2014-08-24 DIAGNOSIS — M545 Low back pain: Secondary | ICD-10-CM | POA: Diagnosis not present

## 2014-08-24 DIAGNOSIS — M542 Cervicalgia: Secondary | ICD-10-CM | POA: Diagnosis not present

## 2015-11-20 ENCOUNTER — Emergency Department (HOSPITAL_COMMUNITY): Payer: Commercial Managed Care - HMO

## 2015-11-20 ENCOUNTER — Encounter (HOSPITAL_COMMUNITY): Payer: Self-pay | Admitting: Emergency Medicine

## 2015-11-20 ENCOUNTER — Emergency Department (HOSPITAL_COMMUNITY)
Admission: EM | Admit: 2015-11-20 | Discharge: 2015-11-20 | Disposition: A | Discharge status: Home / Self Care | Payer: Commercial Managed Care - HMO | Attending: Emergency Medicine | Admitting: Emergency Medicine

## 2015-11-20 DIAGNOSIS — S62616A Displaced fracture of proximal phalanx of right little finger, initial encounter for closed fracture: Secondary | ICD-10-CM | POA: Diagnosis not present

## 2015-11-20 DIAGNOSIS — S301XXA Contusion of abdominal wall, initial encounter: Secondary | ICD-10-CM | POA: Insufficient documentation

## 2015-11-20 DIAGNOSIS — M25559 Pain in unspecified hip: Secondary | ICD-10-CM | POA: Diagnosis not present

## 2015-11-20 DIAGNOSIS — S300XXA Contusion of lower back and pelvis, initial encounter: Secondary | ICD-10-CM | POA: Insufficient documentation

## 2015-11-20 DIAGNOSIS — Y939 Activity, unspecified: Secondary | ICD-10-CM | POA: Insufficient documentation

## 2015-11-20 DIAGNOSIS — R0781 Pleurodynia: Secondary | ICD-10-CM | POA: Diagnosis not present

## 2015-11-20 DIAGNOSIS — S299XXA Unspecified injury of thorax, initial encounter: Secondary | ICD-10-CM | POA: Diagnosis not present

## 2015-11-20 DIAGNOSIS — Y929 Unspecified place or not applicable: Secondary | ICD-10-CM | POA: Insufficient documentation

## 2015-11-20 DIAGNOSIS — S62609A Fracture of unspecified phalanx of unspecified finger, initial encounter for closed fracture: Secondary | ICD-10-CM

## 2015-11-20 DIAGNOSIS — Y999 Unspecified external cause status: Secondary | ICD-10-CM | POA: Insufficient documentation

## 2015-11-20 DIAGNOSIS — F329 Major depressive disorder, single episode, unspecified: Secondary | ICD-10-CM | POA: Diagnosis not present

## 2015-11-20 DIAGNOSIS — S60051A Contusion of right little finger without damage to nail, initial encounter: Secondary | ICD-10-CM | POA: Diagnosis not present

## 2015-11-20 DIAGNOSIS — S7012XA Contusion of left thigh, initial encounter: Secondary | ICD-10-CM | POA: Diagnosis not present

## 2015-11-20 DIAGNOSIS — S6991XA Unspecified injury of right wrist, hand and finger(s), initial encounter: Secondary | ICD-10-CM | POA: Diagnosis present

## 2015-11-20 DIAGNOSIS — T07XXXA Unspecified multiple injuries, initial encounter: Secondary | ICD-10-CM

## 2015-11-20 DIAGNOSIS — W01198A Fall on same level from slipping, tripping and stumbling with subsequent striking against other object, initial encounter: Secondary | ICD-10-CM | POA: Insufficient documentation

## 2015-11-20 DIAGNOSIS — S3993XA Unspecified injury of pelvis, initial encounter: Secondary | ICD-10-CM | POA: Diagnosis not present

## 2015-11-20 MED ORDER — OXYCODONE-ACETAMINOPHEN 5-325 MG PO TABS: 1.0000 | ORAL_TABLET | ORAL | Status: DC | PRN

## 2015-11-20 MED ORDER — OXYCODONE-ACETAMINOPHEN 5-325 MG PO TABS
2.0000 | ORAL_TABLET | Freq: Once | ORAL | Status: AC
Start: 2015-11-20 — End: 2015-11-20
  Administered 2015-11-20: 2 via ORAL
  Filled 2015-11-20: qty 2

## 2015-11-20 NOTE — Discharge Instructions (Signed)
Finger Fracture °Fractures of fingers are breaks in the bones of the fingers. There are many types of fractures. There are different ways of treating these fractures. Your health care provider will discuss the best way to treat your fracture. °CAUSES °Traumatic injury is the main cause of broken fingers. These include: °· Injuries while playing sports. °· Workplace injuries. °· Falls. °RISK FACTORS °Activities that can increase your risk of finger fractures include: °· Sports. °· Workplace activities that involve machinery. °· A condition called osteoporosis, which can make your bones less dense and cause them to fracture more easily. °SIGNS AND SYMPTOMS °The main symptoms of a broken finger are pain and swelling within 15 minutes after the injury. Other symptoms include: °· Bruising of your finger. °· Stiffness of your finger. °· Numbness of your finger. °· Exposed bones (compound fracture) if the fracture is severe. °DIAGNOSIS  °The best way to diagnose a broken bone is with X-ray imaging. Additionally, your health care provider will use this X-ray image to evaluate the position of the broken finger bones.  °TREATMENT  °Finger fractures can be treated with:  °· Nonreduction--This means the bones are in place. The finger is splinted without changing the positions of the bone pieces. The splint is usually left on for about a week to 10 days. This will depend on your fracture and what your health care provider thinks. °· Closed reduction--The bones are put back into position without using surgery. The finger is then splinted. °· Open reduction and internal fixation--The fracture site is opened. Then the bone pieces are fixed into place with pins or some type of hardware. This is seldom required. It depends on the severity of the fracture. °HOME CARE INSTRUCTIONS  °· Follow your health care provider's instructions regarding activities, exercises, and physical therapy. °· Only take over-the-counter or prescription  medicines for pain, discomfort, or fever as directed by your health care provider. °SEEK MEDICAL CARE IF: °You have pain or swelling that limits the motion or use of your fingers. °SEEK IMMEDIATE MEDICAL CARE IF:  °Your finger becomes numb. °MAKE SURE YOU:  °· Understand these instructions. °· Will watch your condition. °· Will get help right away if you are not doing well or get worse. °  °This information is not intended to replace advice given to you by your health care provider. Make sure you discuss any questions you have with your health care provider. °  °Document Released: 09/17/2000 Document Revised: 03/26/2013 Document Reviewed: 01/15/2013 °Elsevier Interactive Patient Education ©2016 [employer] ° °Cast or Splint Care °Casts and splints support injured limbs and keep bones from moving while they heal. It is important to care for your cast or splint at home.   °HOME CARE INSTRUCTIONS °· Keep the cast or splint uncovered during the drying period. It can take 24 to 48 hours to dry if it is made of plaster. A fiberglass cast will dry in less than 1 hour. °· Do not rest the cast on anything harder than a pillow for the first 24 hours. °· Do not put weight on your injured limb or apply pressure to the cast until your health care provider gives you permission. °· Keep the cast or splint dry. Wet casts or splints can lose their shape and may not support the limb as well. A wet cast that has lost its shape can also create harmful pressure on your skin when it dries. Also, wet skin can become infected. °¨ Cover the cast or splint with   a plastic bag when bathing or when out in the rain or snow. If the cast is on the trunk of the body, take sponge baths until the cast is removed. °¨ If your cast does become wet, dry it with a towel or a blow dryer on the cool setting only. °· Keep your cast or splint clean. Soiled casts may be wiped with a moistened cloth. °· Do not place any hard or soft foreign objects under your  cast or splint, such as cotton, toilet paper, lotion, or powder. °· Do not try to scratch the skin under the cast with any object. The object could get stuck inside the cast. Also, scratching could lead to an infection. If itching is a problem, use a blow dryer on a cool setting to relieve discomfort. °· Do not trim or cut your cast or remove padding from inside of it. °· Exercise all joints next to the injury that are not immobilized by the cast or splint. For example, if you have a long leg cast, exercise the hip joint and toes. If you have an arm cast or splint, exercise the shoulder, elbow, thumb, and fingers. °· Elevate your injured arm or leg on 1 or 2 pillows for the first 1 to 3 days to decrease swelling and pain. It is best if you can comfortably elevate your cast so it is higher than your heart. °SEEK MEDICAL CARE IF:  °· Your cast or splint cracks. °· Your cast or splint is too tight or too loose. °· You have unbearable itching inside the cast. °· Your cast becomes wet or develops a soft spot or area. °· You have a bad smell coming from inside your cast. °· You get an object stuck under your cast. °· Your skin around the cast becomes red or raw. °· You have new pain or worsening pain after the cast has been applied. °SEEK IMMEDIATE MEDICAL CARE IF:  °· You have fluid leaking through the cast. °· You are unable to move your fingers or toes. °· You have discolored (blue or white), cool, painful, or very swollen fingers or toes beyond the cast. °· You have tingling or numbness around the injured area. °· You have severe pain or pressure under the cast. °· You have any difficulty with your breathing or have shortness of breath. °· You have chest pain. °  °This information is not intended to replace advice given to you by your health care provider. Make sure you discuss any questions you have with your health care provider. °  °Document Released: 06/02/2000 Document Revised: 03/26/2013 Document Reviewed:  12/12/2012 °Elsevier Interactive Patient Education ©2016 [employer] ° °
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
## 2015-11-26 ENCOUNTER — Other Ambulatory Visit (HOSPITAL_COMMUNITY): Payer: Self-pay | Admitting: Internal Medicine

## 2015-11-26 ENCOUNTER — Ambulatory Visit (HOSPITAL_COMMUNITY)
Admission: RE | Admit: 2015-11-26 | Discharge: 2015-11-26 | Disposition: A | Discharge status: Home / Self Care | Payer: Commercial Managed Care - HMO | Source: Ambulatory Visit | Attending: Internal Medicine | Admitting: Internal Medicine

## 2015-11-26 DIAGNOSIS — W19XXXA Unspecified fall, initial encounter: Secondary | ICD-10-CM | POA: Diagnosis not present

## 2015-11-26 DIAGNOSIS — S300XXA Contusion of lower back and pelvis, initial encounter: Secondary | ICD-10-CM | POA: Insufficient documentation

## 2015-11-26 DIAGNOSIS — R102 Pelvic and perineal pain: Secondary | ICD-10-CM | POA: Diagnosis not present

## 2015-11-26 DIAGNOSIS — S3993XA Unspecified injury of pelvis, initial encounter: Secondary | ICD-10-CM | POA: Insufficient documentation

## 2015-11-26 DIAGNOSIS — I2510 Atherosclerotic heart disease of native coronary artery without angina pectoris: Secondary | ICD-10-CM | POA: Diagnosis not present

## 2015-11-26 DIAGNOSIS — S20211A Contusion of right front wall of thorax, initial encounter: Secondary | ICD-10-CM | POA: Diagnosis not present

## 2015-11-26 DIAGNOSIS — N3090 Cystitis, unspecified without hematuria: Secondary | ICD-10-CM | POA: Diagnosis not present

## 2015-11-26 DIAGNOSIS — S62616A Displaced fracture of proximal phalanx of right little finger, initial encounter for closed fracture: Secondary | ICD-10-CM | POA: Diagnosis not present

## 2015-11-26 MED ORDER — IOPAMIDOL (ISOVUE-300) INJECTION 61%
100.0000 mL | Freq: Once | INTRAVENOUS | Status: AC | PRN
  Administered 2015-11-26: 100 mL via INTRAVENOUS

## 2015-12-01 DIAGNOSIS — S62616A Displaced fracture of proximal phalanx of right little finger, initial encounter for closed fracture: Secondary | ICD-10-CM | POA: Insufficient documentation

## 2015-12-02 ENCOUNTER — Other Ambulatory Visit: Payer: Self-pay | Admitting: Orthopedic Surgery

## 2015-12-02 ENCOUNTER — Encounter (HOSPITAL_BASED_OUTPATIENT_CLINIC_OR_DEPARTMENT_OTHER): Payer: Self-pay | Admitting: *Deleted

## 2015-12-03 ENCOUNTER — Encounter (HOSPITAL_BASED_OUTPATIENT_CLINIC_OR_DEPARTMENT_OTHER)
Admission: RE | Disposition: A | Discharge status: Home / Self Care | Payer: Self-pay | Source: Ambulatory Visit | Attending: Orthopedic Surgery

## 2015-12-03 ENCOUNTER — Ambulatory Visit (HOSPITAL_BASED_OUTPATIENT_CLINIC_OR_DEPARTMENT_OTHER): Payer: Commercial Managed Care - HMO | Admitting: Anesthesiology

## 2015-12-03 ENCOUNTER — Ambulatory Visit (HOSPITAL_BASED_OUTPATIENT_CLINIC_OR_DEPARTMENT_OTHER)
Admission: RE | Admit: 2015-12-03 | Discharge: 2015-12-03 | Disposition: A | Discharge status: Home / Self Care | Payer: Commercial Managed Care - HMO | Source: Ambulatory Visit | Attending: Orthopedic Surgery | Admitting: Orthopedic Surgery

## 2015-12-03 ENCOUNTER — Encounter (HOSPITAL_BASED_OUTPATIENT_CLINIC_OR_DEPARTMENT_OTHER): Payer: Self-pay | Admitting: Certified Registered"

## 2015-12-03 DIAGNOSIS — K219 Gastro-esophageal reflux disease without esophagitis: Secondary | ICD-10-CM | POA: Diagnosis not present

## 2015-12-03 DIAGNOSIS — F419 Anxiety disorder, unspecified: Secondary | ICD-10-CM | POA: Insufficient documentation

## 2015-12-03 DIAGNOSIS — W19XXXA Unspecified fall, initial encounter: Secondary | ICD-10-CM | POA: Diagnosis not present

## 2015-12-03 DIAGNOSIS — S62616A Displaced fracture of proximal phalanx of right little finger, initial encounter for closed fracture: Secondary | ICD-10-CM | POA: Diagnosis not present

## 2015-12-03 HISTORY — DX: Gastro-esophageal reflux disease without esophagitis: K21.9

## 2015-12-03 HISTORY — PX: OPEN REDUCTION INTERNAL FIXATION (ORIF) PROXIMAL PHALANX: SHX6235

## 2015-12-03 SURGERY — OPEN REDUCTION INTERNAL FIXATION (ORIF) PROXIMAL PHALANX
Anesthesia: Monitor Anesthesia Care | Site: Finger | Laterality: Right

## 2015-12-03 MED ORDER — OXYCODONE-ACETAMINOPHEN 7.5-325 MG PO TABS: 1.0000 | ORAL_TABLET | ORAL | Status: DC | PRN

## 2015-12-03 MED ORDER — GLYCOPYRROLATE 0.2 MG/ML IJ SOLN: 0.2000 mg | Freq: Once | INTRAMUSCULAR | Status: DC | PRN

## 2015-12-03 MED ORDER — CHLORHEXIDINE GLUCONATE 4 % EX LIQD: 60.0000 mL | Freq: Once | CUTANEOUS | Status: DC

## 2015-12-03 MED ORDER — ONDANSETRON HCL 4 MG/2ML IJ SOLN
INTRAMUSCULAR | Status: AC
  Filled 2015-12-03: qty 2

## 2015-12-03 MED ORDER — SCOPOLAMINE 1 MG/3DAYS TD PT72: 1.0000 | MEDICATED_PATCH | Freq: Once | TRANSDERMAL | Status: DC | PRN

## 2015-12-03 MED ORDER — FENTANYL CITRATE (PF) 100 MCG/2ML IJ SOLN: 25.0000 ug | INTRAMUSCULAR | Status: DC | PRN

## 2015-12-03 MED ORDER — LIDOCAINE 2% (20 MG/ML) 5 ML SYRINGE
INTRAMUSCULAR | Status: DC | PRN
  Administered 2015-12-03: 25 mg via INTRAVENOUS

## 2015-12-03 MED ORDER — LACTATED RINGERS IV SOLN
INTRAVENOUS | Status: DC
  Administered 2015-12-03 (×2): via INTRAVENOUS

## 2015-12-03 MED ORDER — PROPOFOL 10 MG/ML IV BOLUS
INTRAVENOUS | Status: AC
  Filled 2015-12-03: qty 20

## 2015-12-03 MED ORDER — BUPIVACAINE-EPINEPHRINE (PF) 0.5% -1:200000 IJ SOLN
INTRAMUSCULAR | Status: DC | PRN
  Administered 2015-12-03: 20 mL via PERINEURAL

## 2015-12-03 MED ORDER — MIDAZOLAM HCL 2 MG/2ML IJ SOLN
INTRAMUSCULAR | Status: AC
  Filled 2015-12-03: qty 2

## 2015-12-03 MED ORDER — CEFAZOLIN SODIUM-DEXTROSE 2-4 GM/100ML-% IV SOLN
INTRAVENOUS | Status: AC
  Filled 2015-12-03: qty 100

## 2015-12-03 MED ORDER — MIDAZOLAM HCL 2 MG/2ML IJ SOLN
1.0000 mg | INTRAMUSCULAR | Status: DC | PRN
  Administered 2015-12-03: 1 mg via INTRAVENOUS

## 2015-12-03 MED ORDER — FENTANYL CITRATE (PF) 100 MCG/2ML IJ SOLN
INTRAMUSCULAR | Status: AC
  Filled 2015-12-03: qty 2

## 2015-12-03 MED ORDER — ONDANSETRON HCL 4 MG/2ML IJ SOLN
INTRAMUSCULAR | Status: DC | PRN
  Administered 2015-12-03: 4 mg via INTRAVENOUS

## 2015-12-03 MED ORDER — PROPOFOL 500 MG/50ML IV EMUL
INTRAVENOUS | Status: DC | PRN
Start: 2015-12-03 — End: 2015-12-03
  Administered 2015-12-03: 100 ug/kg/min via INTRAVENOUS

## 2015-12-03 MED ORDER — LIDOCAINE 2% (20 MG/ML) 5 ML SYRINGE
INTRAMUSCULAR | Status: AC
  Filled 2015-12-03: qty 5

## 2015-12-03 MED ORDER — LACTATED RINGERS IV SOLN: INTRAVENOUS | Status: DC

## 2015-12-03 MED ORDER — DEXAMETHASONE SODIUM PHOSPHATE 10 MG/ML IJ SOLN
INTRAMUSCULAR | Status: AC
  Filled 2015-12-03: qty 1

## 2015-12-03 MED ORDER — CEFAZOLIN SODIUM-DEXTROSE 2-4 GM/100ML-% IV SOLN
2.0000 g | INTRAVENOUS | Status: AC
  Administered 2015-12-03: 2 g via INTRAVENOUS

## 2015-12-03 MED ORDER — FENTANYL CITRATE (PF) 100 MCG/2ML IJ SOLN
50.0000 ug | INTRAMUSCULAR | Status: DC | PRN
  Administered 2015-12-03: 100 ug via INTRAVENOUS
  Administered 2015-12-03: 50 ug via INTRAVENOUS

## 2015-12-03 SURGICAL SUPPLY — 57 items
BANDAGE COBAN STERILE 2 (GAUZE/BANDAGES/DRESSINGS) IMPLANT
BIT DRILL 1.0 (BIT) ×2
BIT DRILL 1.0MM (BIT) ×1
BIT DRILL 1.0X50 (BIT) IMPLANT
BLADE MINI RND TIP GREEN BEAV (BLADE) ×2 IMPLANT
BLADE SURG 15 STRL LF DISP TIS (BLADE) ×1 IMPLANT
BLADE SURG 15 STRL SS (BLADE) ×3
BNDG CMPR 9X4 STRL LF SNTH (GAUZE/BANDAGES/DRESSINGS) ×1
BNDG COHESIVE 1X5 TAN STRL LF (GAUZE/BANDAGES/DRESSINGS) IMPLANT
BNDG COHESIVE 3X5 TAN STRL LF (GAUZE/BANDAGES/DRESSINGS) ×3 IMPLANT
BNDG ESMARK 4X9 LF (GAUZE/BANDAGES/DRESSINGS) ×3 IMPLANT
BNDG GAUZE ELAST 4 BULKY (GAUZE/BANDAGES/DRESSINGS) IMPLANT
CHLORAPREP W/TINT 26ML (MISCELLANEOUS) ×3 IMPLANT
CORDS BIPOLAR (ELECTRODE) ×3 IMPLANT
COVER BACK TABLE 60X90IN (DRAPES) ×3 IMPLANT
COVER MAYO STAND STRL (DRAPES) ×3 IMPLANT
CUFF TOURNIQUET SINGLE 18IN (TOURNIQUET CUFF) ×2 IMPLANT
DECANTER SPIKE VIAL GLASS SM (MISCELLANEOUS) IMPLANT
DRAPE EXTREMITY T 121X128X90 (DRAPE) ×3 IMPLANT
DRAPE OEC MINIVIEW 54X84 (DRAPES) ×3 IMPLANT
DRAPE SURG 17X23 STRL (DRAPES) ×3 IMPLANT
GAUZE SPONGE 4X4 12PLY STRL (GAUZE/BANDAGES/DRESSINGS) ×3 IMPLANT
GAUZE XEROFORM 1X8 LF (GAUZE/BANDAGES/DRESSINGS) ×3 IMPLANT
GLOVE BIOGEL M STRL SZ7.5 (GLOVE) ×2 IMPLANT
GLOVE BIOGEL PI IND STRL 7.0 (GLOVE) IMPLANT
GLOVE BIOGEL PI IND STRL 8.5 (GLOVE) ×1 IMPLANT
GLOVE BIOGEL PI INDICATOR 7.0 (GLOVE) ×2
GLOVE BIOGEL PI INDICATOR 8.5 (GLOVE) ×2
GLOVE ECLIPSE 6.5 STRL STRAW (GLOVE) ×2 IMPLANT
GLOVE SURG ORTHO 8.0 STRL STRW (GLOVE) ×3 IMPLANT
GOWN STRL REUS W/ TWL LRG LVL3 (GOWN DISPOSABLE) IMPLANT
GOWN STRL REUS W/TWL LRG LVL3 (GOWN DISPOSABLE) ×3
GOWN STRL REUS W/TWL XL LVL3 (GOWN DISPOSABLE) ×5 IMPLANT
NDL PRECISIONGLIDE 27X1.5 (NEEDLE) IMPLANT
NEEDLE PRECISIONGLIDE 27X1.5 (NEEDLE) IMPLANT
NS IRRIG 1000ML POUR BTL (IV SOLUTION) ×3 IMPLANT
PACK BASIN DAY SURGERY FS (CUSTOM PROCEDURE TRAY) ×3 IMPLANT
PAD CAST 3X4 CTTN HI CHSV (CAST SUPPLIES) ×1 IMPLANT
PADDING CAST ABS 4INX4YD NS (CAST SUPPLIES) ×2
PADDING CAST ABS COTTON 4X4 ST (CAST SUPPLIES) ×1 IMPLANT
PADDING CAST COTTON 3X4 STRL (CAST SUPPLIES) ×3
SCREW SELF TAP CORTEX 1.3 10MM (Screw) ×6 IMPLANT
SCREW SELF TAP CORTEX 1.3 11MM (Screw) ×2 IMPLANT
SCREW SELF TAP CORTEX 1.3 9MM (Screw) ×2 IMPLANT
SLEEVE SCD COMPRESS KNEE MED (MISCELLANEOUS) IMPLANT
SPLINT PLASTER CAST XFAST 3X15 (CAST SUPPLIES) IMPLANT
SPLINT PLASTER XTRA FASTSET 3X (CAST SUPPLIES)
STOCKINETTE 4X48 STRL (DRAPES) ×3 IMPLANT
SUT CHROMIC 5 0 P 3 (SUTURE) IMPLANT
SUT CHROMIC 6 0 PS 4 (SUTURE) IMPLANT
SUT ETHILON 4 0 PS 2 18 (SUTURE) ×3 IMPLANT
SUT MERSILENE 4 0 P 3 (SUTURE) IMPLANT
SUT VICRYL 4-0 PS2 18IN ABS (SUTURE) IMPLANT
SYR BULB 3OZ (MISCELLANEOUS) ×3 IMPLANT
SYR CONTROL 10ML LL (SYRINGE) IMPLANT
TOWEL OR 17X24 6PK STRL BLUE (TOWEL DISPOSABLE) ×3 IMPLANT
UNDERPAD 30X30 (UNDERPADS AND DIAPERS) ×3 IMPLANT

## 2015-12-03 NOTE — Transfer of Care (Signed)
Immediate Anesthesia Transfer of Care Note  Patient: Nathan Sanchez  Procedure(s) Performed: Procedure(s): OPEN REDUCTION INTERNAL FIXATION (ORIF) RIGHT SMALL PROXIMAL PHALANX (Right)  Patient Location: PACU  Anesthesia Type:MAC combined with regional for post-op pain  Level of Consciousness: awake, alert , oriented and patient cooperative  Airway & Oxygen Therapy: Patient Spontanous Breathing and Patient connected to face mask oxygen  Post-op Assessment: Report given to RN, Post -op Vital signs reviewed and stable and Patient moving all extremities  Post vital signs: Reviewed and stable  Last Vitals:  Filed Vitals:   12/03/15 1415 12/03/15 1430  BP: 154/91 139/87  Pulse: 53 57  Temp:    Resp:  16    Last Pain:  Filed Vitals:   12/03/15 1445  PainSc: 7       Patients Stated Pain Goal: 3 (12/03/15 1335)  Complications: No apparent anesthesia complications

## 2015-12-03 NOTE — Discharge Instructions (Addendum)

## 2015-12-03 NOTE — Op Note (Signed)
Dictation Number [id number]

## 2015-12-03 NOTE — Anesthesia Procedure Notes (Addendum)
Anesthesia Regional Block:  Nerve block type: Ulnar nerve block.  Pre-Anesthetic Checklist: ,, timeout performed, Correct Patient, Correct Site, Correct Laterality, Correct Procedure, Correct Position, site marked, Risks and benefits discussed,  Surgical consent,  Pre-op evaluation,  At surgeon's request and post-op pain management  Laterality: Upper and Right  Prep: chloraprep       Needles:  Injection technique: Single-shot  Needle Type: Echogenic Needle          Additional Needles:  Procedures: ultrasound guided (picture in chart) Nerve block type: Ulnar nerve block. Narrative:  Injection made incrementally with aspirations every 5 mL.  Performed by: Personally   Additional Notes: H+P and labs reviewed, risks and benefits discussed with patient, procedure tolerated well without complications   Procedure Name: MAC Date/Time: 12/03/2015 3:08 PM Performed by: Curly ShoresRAFT, Ksenia Kunz W Pre-anesthesia Checklist: Patient identified, Emergency Drugs available, Suction available and Patient being monitored Patient Re-evaluated:Patient Re-evaluated prior to inductionOxygen Delivery Method: Simple face mask Preoxygenation: Pre-oxygenation with 100% oxygen Intubation Type: IV induction Ventilation: Mask ventilation without difficulty Dental Injury: Teeth and Oropharynx as per pre-operative assessment
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
## 2015-12-03 NOTE — Anesthesia Postprocedure Evaluation (Signed)
Anesthesia Post Note  Patient: Nathan Sanchez  Procedure(s) Performed: Procedure(s) (LRB): OPEN REDUCTION INTERNAL FIXATION (ORIF) RIGHT SMALL PROXIMAL PHALANX (Right)  Patient location during evaluation: PACU Anesthesia Type: Regional and MAC Level of consciousness: awake Pain management: pain level controlled Vital Signs Assessment: post-procedure vital signs reviewed and stable Respiratory status: spontaneous breathing Cardiovascular status: stable Postop Assessment: no signs of nausea or vomiting Anesthetic complications: no    Last Vitals:  Filed Vitals:   12/03/15 1615 12/03/15 1645  BP: 129/86 143/75  Pulse: 49 52  Temp:  36.5 C  Resp: 17 16    Last Pain:  Filed Vitals:   12/03/15 1651  PainSc: 0-No pain                 Tinea Nobile

## 2015-12-03 NOTE — Brief Op Note (Signed)
12/03/2015  4:09 PM  PATIENT:  Nathan Sanchez  55 y.o. male  PRE-OPERATIVE DIAGNOSIS:  FRACTURE PROXIMAL PHALANX RIGHT SMALL FINGER  POST-OPERATIVE DIAGNOSIS:  FRACTURE PROXIMAL PHALANX RIGHT SMALL FINGER  PROCEDURE:  Procedure(s): OPEN REDUCTION INTERNAL FIXATION (ORIF) RIGHT SMALL PROXIMAL PHALANX (Right)  SURGEON:  Surgeon(s) and Role:    * Cindee SaltGary Junelle Hashemi, MD - Primary  PHYSICIAN ASSISTANT:   ASSISTANTS: R Dasnoit, PAC   ANESTHESIA:   regional and IV sedation  EBL:  Total I/O In: 1000 [I.V.:1000] Out: 1 [Blood:1]  BLOOD ADMINISTERED:none  DRAINS: none   LOCAL MEDICATIONS USED:  NONE  SPECIMEN:  No Specimen  DISPOSITION OF SPECIMEN:  N/A  COUNTS:  YES  TOURNIQUET:   Total Tourniquet Time Documented: Upper Arm (Right) - 42 minutes Total: Upper Arm (Right) - 42 minutes   DICTATION: .Other Dictation: Dictation Number [id number]  PLAN OF CARE: Discharge to home after PACU  PATIENT DISPOSITION:  PACU - hemodynamically stable.

## 2015-12-03 NOTE — Anesthesia Preprocedure Evaluation (Addendum)
Anesthesia Evaluation  Patient identified by MRN, date of birth, ID band Patient awake    Reviewed: Allergy & Precautions, H&P , NPO status , Patient's Chart, lab work & pertinent test results  Airway Mallampati: II  TM Distance: >3 FB Neck ROM: full    Dental no notable dental hx. (+) Dental Advisory Given, Teeth Intact   Pulmonary neg pulmonary ROS,    Pulmonary exam normal breath sounds clear to auscultation       Cardiovascular Exercise Tolerance: Good negative cardio ROS Normal cardiovascular exam Rhythm:regular Rate:Normal     Neuro/Psych PSYCHIATRIC DISORDERS Anxiety Depression negative neurological ROS     GI/Hepatic negative GI ROS, Neg liver ROS,   Endo/Other  negative endocrine ROS  Renal/GU negative Renal ROS  negative genitourinary   Musculoskeletal   Abdominal   Peds  Hematology negative hematology ROS (+)   Anesthesia Other Findings   Reproductive/Obstetrics negative OB ROS                            Anesthesia Physical Anesthesia Plan  ASA: II  Anesthesia Plan: MAC and Regional   Post-op Pain Management:    Induction: Intravenous  Airway Management Planned: Natural Airway, Nasal Cannula and Simple Face Mask  Additional Equipment: None  Intra-op Plan:   Post-operative Plan:   Informed Consent: I have reviewed the patients History and Physical, chart, labs and discussed the procedure including the risks, benefits and alternatives for the proposed anesthesia with the patient or authorized representative who has indicated his/her understanding and acceptance.   Dental Advisory Given  Plan Discussed with: CRNA and Surgeon  Anesthesia Plan Comments:        Anesthesia Quick Evaluation
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
## 2015-12-06 ENCOUNTER — Encounter (HOSPITAL_BASED_OUTPATIENT_CLINIC_OR_DEPARTMENT_OTHER): Payer: Self-pay | Admitting: Orthopedic Surgery

## 2015-12-10 DIAGNOSIS — S62616D Displaced fracture of proximal phalanx of right little finger, subsequent encounter for fracture with routine healing: Secondary | ICD-10-CM | POA: Diagnosis not present

## 2015-12-10 DIAGNOSIS — M79644 Pain in right finger(s): Secondary | ICD-10-CM | POA: Diagnosis not present
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
## 2016-04-28 DIAGNOSIS — G894 Chronic pain syndrome: Secondary | ICD-10-CM | POA: Diagnosis not present

## 2016-04-28 DIAGNOSIS — Z23 Encounter for immunization: Secondary | ICD-10-CM | POA: Diagnosis not present

## 2016-05-23 DIAGNOSIS — M10071 Idiopathic gout, right ankle and foot: Secondary | ICD-10-CM | POA: Diagnosis not present

## 2016-06-27 DIAGNOSIS — F1410 Cocaine abuse, uncomplicated: Secondary | ICD-10-CM | POA: Diagnosis not present

## 2016-06-27 DIAGNOSIS — M6281 Muscle weakness (generalized): Secondary | ICD-10-CM | POA: Diagnosis not present

## 2016-06-27 DIAGNOSIS — R4182 Altered mental status, unspecified: Secondary | ICD-10-CM | POA: Diagnosis not present

## 2016-06-27 DIAGNOSIS — G8382 Anterior cord syndrome: Secondary | ICD-10-CM | POA: Diagnosis not present

## 2016-06-27 DIAGNOSIS — W1789XA Other fall from one level to another, initial encounter: Secondary | ICD-10-CM | POA: Diagnosis not present

## 2016-06-27 DIAGNOSIS — M109 Gout, unspecified: Secondary | ICD-10-CM | POA: Diagnosis not present

## 2016-06-27 DIAGNOSIS — M10042 Idiopathic gout, left hand: Secondary | ICD-10-CM | POA: Diagnosis not present

## 2016-06-27 DIAGNOSIS — M10041 Idiopathic gout, right hand: Secondary | ICD-10-CM | POA: Diagnosis not present

## 2016-06-27 DIAGNOSIS — S098XXA Other specified injuries of head, initial encounter: Secondary | ICD-10-CM | POA: Diagnosis not present

## 2016-06-27 DIAGNOSIS — S1980XA Other specified injuries of unspecified part of neck, initial encounter: Secondary | ICD-10-CM | POA: Diagnosis not present

## 2016-06-27 DIAGNOSIS — S3982XA Other specified injuries of lower back, initial encounter: Secondary | ICD-10-CM | POA: Diagnosis not present

## 2016-06-27 DIAGNOSIS — M545 Low back pain: Secondary | ICD-10-CM | POA: Diagnosis not present

## 2016-06-27 DIAGNOSIS — R200 Anesthesia of skin: Secondary | ICD-10-CM | POA: Diagnosis not present

## 2016-06-28 DIAGNOSIS — M542 Cervicalgia: Secondary | ICD-10-CM | POA: Insufficient documentation

## 2016-06-28 DIAGNOSIS — T84216A Breakdown (mechanical) of internal fixation device of vertebrae, initial encounter: Secondary | ICD-10-CM | POA: Diagnosis not present

## 2016-06-28 DIAGNOSIS — R531 Weakness: Secondary | ICD-10-CM | POA: Diagnosis not present

## 2016-06-28 DIAGNOSIS — T859XXA Unspecified complication of internal prosthetic device, implant and graft, initial encounter: Secondary | ICD-10-CM | POA: Diagnosis not present

## 2016-06-28 DIAGNOSIS — M79604 Pain in right leg: Secondary | ICD-10-CM | POA: Diagnosis not present

## 2016-06-28 DIAGNOSIS — M79605 Pain in left leg: Secondary | ICD-10-CM | POA: Diagnosis not present

## 2016-06-28 DIAGNOSIS — M549 Dorsalgia, unspecified: Secondary | ICD-10-CM | POA: Diagnosis not present

## 2016-06-28 DIAGNOSIS — F1410 Cocaine abuse, uncomplicated: Secondary | ICD-10-CM | POA: Diagnosis not present

## 2016-06-28 DIAGNOSIS — M109 Gout, unspecified: Secondary | ICD-10-CM | POA: Diagnosis not present

## 2016-06-28 DIAGNOSIS — R52 Pain, unspecified: Secondary | ICD-10-CM | POA: Diagnosis not present

## 2016-06-28 DIAGNOSIS — S199XXA Unspecified injury of neck, initial encounter: Secondary | ICD-10-CM | POA: Diagnosis not present

## 2016-06-28 DIAGNOSIS — G8382 Anterior cord syndrome: Secondary | ICD-10-CM | POA: Diagnosis not present

## 2016-06-28 DIAGNOSIS — R200 Anesthesia of skin: Secondary | ICD-10-CM | POA: Diagnosis not present

## 2016-06-28 DIAGNOSIS — W1789XA Other fall from one level to another, initial encounter: Secondary | ICD-10-CM | POA: Diagnosis not present

## 2016-07-05 DIAGNOSIS — M5412 Radiculopathy, cervical region: Secondary | ICD-10-CM | POA: Diagnosis not present

## 2016-07-05 DIAGNOSIS — M542 Cervicalgia: Secondary | ICD-10-CM | POA: Diagnosis not present

## 2016-07-05 DIAGNOSIS — T84216A Breakdown (mechanical) of internal fixation device of vertebrae, initial encounter: Secondary | ICD-10-CM | POA: Diagnosis not present

## 2016-09-29 DIAGNOSIS — M109 Gout, unspecified: Secondary | ICD-10-CM | POA: Diagnosis not present

## 2016-09-29 DIAGNOSIS — Z2889 Immunization not carried out for other reason: Secondary | ICD-10-CM | POA: Diagnosis not present

## 2016-09-29 DIAGNOSIS — M79671 Pain in right foot: Secondary | ICD-10-CM | POA: Diagnosis not present

## 2016-09-29 DIAGNOSIS — M79672 Pain in left foot: Secondary | ICD-10-CM | POA: Diagnosis not present

## 2017-02-20 ENCOUNTER — Encounter: Payer: Self-pay | Admitting: Orthopaedic Surgery

## 2017-02-20 ENCOUNTER — Ambulatory Visit (INDEPENDENT_AMBULATORY_CARE_PROVIDER_SITE_OTHER): Payer: Medicare HMO

## 2017-02-20 ENCOUNTER — Ambulatory Visit (INDEPENDENT_AMBULATORY_CARE_PROVIDER_SITE_OTHER): Payer: Medicare HMO | Admitting: Orthopaedic Surgery

## 2017-02-20 ENCOUNTER — Encounter: Payer: Self-pay | Admitting: Radiology

## 2017-02-20 VITALS — BP 159/90 | HR 62 | Temp 97.6°F | Ht 74.0 in | Wt 223.0 lb

## 2017-02-20 DIAGNOSIS — M25561 Pain in right knee: Secondary | ICD-10-CM | POA: Diagnosis not present

## 2017-02-20 DIAGNOSIS — M5441 Lumbago with sciatica, right side: Secondary | ICD-10-CM

## 2017-02-20 DIAGNOSIS — G8929 Other chronic pain: Secondary | ICD-10-CM

## 2017-02-20 MED ORDER — NAPROXEN 500 MG PO TABS: 500.0000 mg | ORAL_TABLET | Freq: Two times a day (BID) | ORAL | 5 refills | Status: DC
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
## 2017-02-26 ENCOUNTER — Other Ambulatory Visit (HOSPITAL_COMMUNITY): Payer: Self-pay | Admitting: Diagnostic Radiology

## 2017-02-26 ENCOUNTER — Other Ambulatory Visit: Payer: Self-pay | Admitting: Orthopaedic Surgery

## 2017-02-26 ENCOUNTER — Other Ambulatory Visit: Payer: Self-pay | Admitting: *Deleted

## 2017-02-26 ENCOUNTER — Ambulatory Visit (HOSPITAL_COMMUNITY)
Admission: RE | Admit: 2017-02-26 | Discharge: 2017-02-26 | Disposition: A | Discharge status: Home / Self Care | Payer: Medicare HMO | Source: Ambulatory Visit | Attending: Orthopaedic Surgery | Admitting: Orthopaedic Surgery

## 2017-02-26 DIAGNOSIS — M5441 Lumbago with sciatica, right side: Secondary | ICD-10-CM | POA: Insufficient documentation

## 2017-02-26 DIAGNOSIS — M5126 Other intervertebral disc displacement, lumbar region: Secondary | ICD-10-CM | POA: Diagnosis not present

## 2017-02-26 DIAGNOSIS — G8929 Other chronic pain: Secondary | ICD-10-CM | POA: Insufficient documentation

## 2017-02-26 DIAGNOSIS — Z135 Encounter for screening for eye and ear disorders: Secondary | ICD-10-CM | POA: Diagnosis not present

## 2017-02-26 DIAGNOSIS — M48061 Spinal stenosis, lumbar region without neurogenic claudication: Secondary | ICD-10-CM | POA: Diagnosis not present

## 2017-02-26 DIAGNOSIS — M545 Low back pain: Secondary | ICD-10-CM | POA: Diagnosis not present

## 2017-02-26 DIAGNOSIS — M795 Residual foreign body in soft tissue: Secondary | ICD-10-CM | POA: Insufficient documentation

## 2017-02-28 ENCOUNTER — Ambulatory Visit: Payer: Self-pay | Admitting: Orthopaedic Surgery

## 2017-03-13 ENCOUNTER — Ambulatory Visit (INDEPENDENT_AMBULATORY_CARE_PROVIDER_SITE_OTHER): Payer: Medicare HMO | Admitting: Orthopaedic Surgery

## 2017-03-13 ENCOUNTER — Encounter: Payer: Self-pay | Admitting: Orthopaedic Surgery

## 2017-03-13 VITALS — BP 174/111 | HR 80 | Temp 97.5°F | Ht 74.0 in | Wt 214.0 lb

## 2017-03-13 DIAGNOSIS — G8929 Other chronic pain: Secondary | ICD-10-CM | POA: Diagnosis not present

## 2017-03-13 DIAGNOSIS — M5441 Lumbago with sciatica, right side: Secondary | ICD-10-CM | POA: Diagnosis not present

## 2017-03-13 MED ORDER — OXYCODONE-ACETAMINOPHEN 7.5-325 MG PO TABS: ORAL_TABLET | ORAL | 0 refills | Status: DC
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
## 2017-03-20 ENCOUNTER — Telehealth: Payer: Self-pay | Admitting: Orthopaedic Surgery

## 2017-03-20 NOTE — Telephone Encounter (Signed)
Patient's girlfriend called stating that Nathan Sanchez has an appointment scheduled to see Dr. Newell Coral on 04/24/17. She states he was told that Dr. Newell Coral would be out of the office until then. She stated that Kewan was worse and you had told him to let you know and you may be able to push for an appointment sooner.  Please advise.

## 2017-03-23 NOTE — Telephone Encounter (Signed)
Patient called again today, 03/23/17, to ask Dr Hilda Lias if he would consider prescrbing something for his pain; states he assumes it will take awhile until he gets in to see Dr Newell Coral. Please advise.

## 2017-03-26 MED ORDER — OXYCODONE-ACETAMINOPHEN 7.5-325 MG PO TABS: ORAL_TABLET | ORAL | 0 refills | Status: DC

## 2017-04-04 ENCOUNTER — Telehealth: Payer: Self-pay | Admitting: Orthopedic Surgery

## 2017-04-04 NOTE — Telephone Encounter (Signed)
Patient states that he does not have an appointment to see the neurosurgeon until the 5th therefore he asks if you would refill   Oxycodone/Acetaminophen 7.5-325  Mgs.   Qty  30   Sig: One tablet every four hours as needed for acute pain. Five day limit consistent with [place] Stature.

## 2017-04-05 MED ORDER — OXYCODONE-ACETAMINOPHEN 7.5-325 MG PO TABS: 1.0000 | ORAL_TABLET | Freq: Four times a day (QID) | ORAL | 0 refills | Status: DC | PRN

## 2017-04-24 DIAGNOSIS — M5416 Radiculopathy, lumbar region: Secondary | ICD-10-CM | POA: Diagnosis not present

## 2017-04-24 DIAGNOSIS — M48062 Spinal stenosis, lumbar region with neurogenic claudication: Secondary | ICD-10-CM | POA: Diagnosis not present

## 2017-04-24 DIAGNOSIS — M5126 Other intervertebral disc displacement, lumbar region: Secondary | ICD-10-CM | POA: Diagnosis not present

## 2017-04-24 DIAGNOSIS — M5136 Other intervertebral disc degeneration, lumbar region: Secondary | ICD-10-CM | POA: Diagnosis not present

## 2017-04-24 DIAGNOSIS — Z6828 Body mass index (BMI) 28.0-28.9, adult: Secondary | ICD-10-CM | POA: Diagnosis not present

## 2017-04-24 DIAGNOSIS — M47816 Spondylosis without myelopathy or radiculopathy, lumbar region: Secondary | ICD-10-CM | POA: Diagnosis not present

## 2017-04-24 DIAGNOSIS — M549 Dorsalgia, unspecified: Secondary | ICD-10-CM | POA: Diagnosis not present

## 2017-04-24 DIAGNOSIS — M546 Pain in thoracic spine: Secondary | ICD-10-CM | POA: Diagnosis not present

## 2017-04-24 DIAGNOSIS — M4316 Spondylolisthesis, lumbar region: Secondary | ICD-10-CM | POA: Diagnosis not present

## 2017-05-01 ENCOUNTER — Telehealth: Payer: Self-pay | Admitting: Orthopaedic Surgery

## 2017-05-01 MED ORDER — OXYCODONE-ACETAMINOPHEN 7.5-325 MG PO TABS: ORAL_TABLET | ORAL | 0 refills | Status: DC

## 2017-05-01 NOTE — Telephone Encounter (Signed)
Request refill on Oxycodone/Acetaminopphen 7.5-325  Mgs.   Qty  56   Sig: Take 1 tablet by mouth every 6 (six) hours as needed for moderate pain or severe pain (Must last 14 days.Do not take and drive a car or operate machinery).

## 2017-08-02 ENCOUNTER — Telehealth: Payer: Self-pay | Admitting: Orthopaedic Surgery
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
## 2017-08-06 NOTE — Telephone Encounter (Signed)
See if Dr. Romeo AppleHarrison has opening as I am going out of town.  If not, then [facility].  Thanks.

## 2017-08-07 ENCOUNTER — Telehealth: Payer: Self-pay | Admitting: Orthopaedic Surgery

## 2017-08-07 NOTE — Telephone Encounter (Signed)
Mr. Nathan Sanchez called asking if Dr. Hilda LiasKeeling would write him a prescription for pain medication until he could be seen by Dr.Harrison. I explained to him that Dr. Hilda LiasKeeling hasn't seen him in over 3 months and he would not be able to do that. He was very rude and stated that he didn't understand why he wouldn't. I told him that Dr. Hilda LiasKeeling has rules to go by when it comes to prescribing pain medication. He stated that Dr. Hilda LiasKeeling is always out and hung up on me.

## 2017-08-07 NOTE — Telephone Encounter (Signed)
Mr. Nathan Sanchez called back and apologized for getting upset.  He said he wants to go to pain management but that he can't get no one, not his family doctor, no one to refer him for pain management.    He wants to know if we can refer to pain management or does someone else need to do this?

## 2017-08-08 ENCOUNTER — Other Ambulatory Visit: Payer: Self-pay | Admitting: Radiology

## 2017-08-08 DIAGNOSIS — M5441 Lumbago with sciatica, right side: Secondary | ICD-10-CM

## 2017-08-08 NOTE — Telephone Encounter (Signed)
The referral was faxed to [facility] [phone] Phone number is [phone].  I called the patient and told him to expect their call.

## 2017-08-08 NOTE — Telephone Encounter (Signed)
See if you can get him in that clinic that stopped by the other day.  Thanks.

## 2017-08-17 DIAGNOSIS — M48061 Spinal stenosis, lumbar region without neurogenic claudication: Secondary | ICD-10-CM | POA: Diagnosis not present

## 2017-08-17 DIAGNOSIS — Z79899 Other long term (current) drug therapy: Secondary | ICD-10-CM | POA: Diagnosis not present

## 2017-08-31 DIAGNOSIS — Z79899 Other long term (current) drug therapy: Secondary | ICD-10-CM | POA: Diagnosis not present

## 2017-08-31 DIAGNOSIS — M48061 Spinal stenosis, lumbar region without neurogenic claudication: Secondary | ICD-10-CM | POA: Diagnosis not present

## 2017-10-01 DIAGNOSIS — Z79899 Other long term (current) drug therapy: Secondary | ICD-10-CM | POA: Diagnosis not present

## 2017-10-01 DIAGNOSIS — M5136 Other intervertebral disc degeneration, lumbar region: Secondary | ICD-10-CM | POA: Diagnosis not present

## 2017-10-31 DIAGNOSIS — Z79899 Other long term (current) drug therapy: Secondary | ICD-10-CM | POA: Diagnosis not present

## 2017-10-31 DIAGNOSIS — M48061 Spinal stenosis, lumbar region without neurogenic claudication: Secondary | ICD-10-CM | POA: Diagnosis not present

## 2017-11-30 DIAGNOSIS — Z79899 Other long term (current) drug therapy: Secondary | ICD-10-CM | POA: Diagnosis not present

## 2017-11-30 DIAGNOSIS — M48061 Spinal stenosis, lumbar region without neurogenic claudication: Secondary | ICD-10-CM | POA: Diagnosis not present

## 2017-12-28 DIAGNOSIS — Z79899 Other long term (current) drug therapy: Secondary | ICD-10-CM | POA: Diagnosis not present

## 2017-12-28 DIAGNOSIS — M1990 Unspecified osteoarthritis, unspecified site: Secondary | ICD-10-CM | POA: Diagnosis not present

## 2017-12-28 DIAGNOSIS — G894 Chronic pain syndrome: Secondary | ICD-10-CM | POA: Diagnosis not present

## 2017-12-28 DIAGNOSIS — M48061 Spinal stenosis, lumbar region without neurogenic claudication: Secondary | ICD-10-CM | POA: Diagnosis not present

## 2018-01-30 DIAGNOSIS — G894 Chronic pain syndrome: Secondary | ICD-10-CM | POA: Diagnosis not present

## 2018-01-30 DIAGNOSIS — Z79899 Other long term (current) drug therapy: Secondary | ICD-10-CM | POA: Diagnosis not present

## 2018-01-30 DIAGNOSIS — M48061 Spinal stenosis, lumbar region without neurogenic claudication: Secondary | ICD-10-CM | POA: Diagnosis not present

## 2018-01-30 DIAGNOSIS — R21 Rash and other nonspecific skin eruption: Secondary | ICD-10-CM | POA: Diagnosis not present

## 2018-03-01 DIAGNOSIS — R21 Rash and other nonspecific skin eruption: Secondary | ICD-10-CM | POA: Diagnosis not present

## 2018-03-01 DIAGNOSIS — M5136 Other intervertebral disc degeneration, lumbar region: Secondary | ICD-10-CM | POA: Diagnosis not present

## 2018-03-01 DIAGNOSIS — Z79899 Other long term (current) drug therapy: Secondary | ICD-10-CM | POA: Diagnosis not present

## 2018-03-01 DIAGNOSIS — G894 Chronic pain syndrome: Secondary | ICD-10-CM | POA: Diagnosis not present

## 2018-05-01 DIAGNOSIS — M5136 Other intervertebral disc degeneration, lumbar region: Secondary | ICD-10-CM | POA: Diagnosis not present

## 2018-05-01 DIAGNOSIS — G894 Chronic pain syndrome: Secondary | ICD-10-CM | POA: Diagnosis not present

## 2018-05-01 DIAGNOSIS — Z79899 Other long term (current) drug therapy: Secondary | ICD-10-CM | POA: Diagnosis not present

## 2018-07-01 DIAGNOSIS — F1120 Opioid dependence, uncomplicated: Secondary | ICD-10-CM | POA: Diagnosis not present

## 2018-07-01 DIAGNOSIS — G894 Chronic pain syndrome: Secondary | ICD-10-CM | POA: Diagnosis not present

## 2018-07-01 DIAGNOSIS — Z79899 Other long term (current) drug therapy: Secondary | ICD-10-CM | POA: Diagnosis not present

## 2018-07-01 DIAGNOSIS — M48061 Spinal stenosis, lumbar region without neurogenic claudication: Secondary | ICD-10-CM | POA: Diagnosis not present

## 2018-09-30 DIAGNOSIS — Z79899 Other long term (current) drug therapy: Secondary | ICD-10-CM | POA: Diagnosis not present

## 2018-09-30 DIAGNOSIS — G894 Chronic pain syndrome: Secondary | ICD-10-CM | POA: Diagnosis not present

## 2018-09-30 DIAGNOSIS — M5136 Other intervertebral disc degeneration, lumbar region: Secondary | ICD-10-CM | POA: Diagnosis not present

## 2021-08-04 ENCOUNTER — Other Ambulatory Visit (HOSPITAL_COMMUNITY): Payer: Self-pay | Admitting: Nurse Practitioner

## 2021-08-04 ENCOUNTER — Other Ambulatory Visit: Payer: Self-pay | Admitting: Nurse Practitioner

## 2021-08-04 DIAGNOSIS — M542 Cervicalgia: Secondary | ICD-10-CM

## 2021-08-18 ENCOUNTER — Encounter (HOSPITAL_COMMUNITY): Payer: Self-pay

## 2021-08-18 ENCOUNTER — Emergency Department (HOSPITAL_COMMUNITY)
Admission: EM | Admit: 2021-08-18 | Discharge: 2021-08-18 | Disposition: A | Discharge status: Home / Self Care | Payer: Medicare Other | Attending: Emergency Medicine | Admitting: Emergency Medicine

## 2021-08-18 ENCOUNTER — Other Ambulatory Visit: Payer: Self-pay

## 2021-08-18 DIAGNOSIS — S41111A Laceration without foreign body of right upper arm, initial encounter: Secondary | ICD-10-CM | POA: Diagnosis not present

## 2021-08-18 DIAGNOSIS — Z23 Encounter for immunization: Secondary | ICD-10-CM | POA: Diagnosis not present

## 2021-08-18 DIAGNOSIS — S4991XA Unspecified injury of right shoulder and upper arm, initial encounter: Secondary | ICD-10-CM | POA: Diagnosis present

## 2021-08-18 DIAGNOSIS — W268XXA Contact with other sharp object(s), not elsewhere classified, initial encounter: Secondary | ICD-10-CM | POA: Insufficient documentation

## 2021-08-18 DIAGNOSIS — E119 Type 2 diabetes mellitus without complications: Secondary | ICD-10-CM | POA: Diagnosis not present

## 2021-08-18 HISTORY — DX: Type 2 diabetes mellitus without complications: E11.9

## 2021-08-18 MED ORDER — CEPHALEXIN 500 MG PO CAPS: 500.0000 mg | ORAL_CAPSULE | Freq: Two times a day (BID) | ORAL | 0 refills | Status: AC

## 2021-08-18 MED ORDER — LIDOCAINE-EPINEPHRINE (PF) 2 %-1:200000 IJ SOLN
10.0000 mL | Freq: Once | INTRAMUSCULAR | Status: AC
  Administered 2021-08-18: 10 mL
  Filled 2021-08-18: qty 20

## 2021-08-18 MED ORDER — TETANUS-DIPHTH-ACELL PERTUSSIS 5-2.5-18.5 LF-MCG/0.5 IM SUSY
0.5000 mL | PREFILLED_SYRINGE | Freq: Once | INTRAMUSCULAR | Status: AC
  Administered 2021-08-18: 0.5 mL via INTRAMUSCULAR
  Filled 2021-08-18: qty 0.5

## 2021-08-18 NOTE — Discharge Instructions (Signed)
You have received 5 sutures in your arm, please refrain from getting wet for the first 24 hours, starting tomorrow like you to rinse out the wound and change the dressings 2 times daily.  Starting on antibiotics please take as prescribed ? ?Please follow-up with your PCP, urgent care, this department in the next 10 to 12 days for suture removal ? ?Please come back if you start to notice discharge coming from the wound, increased redness, worsening pain pain  concerning signs of infection ?
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
## 2021-08-18 NOTE — ED Provider Notes (Signed)
[facility] EMERGENCY DEPARTMENT Provider Note   CSN: [id number] Arrival date & time: 08/18/21  1222     History  Chief Complaint  Patient presents with   Laceration    Nathan Sanchez is a 61 y.o. male.  HPI  Patient with medical history including diabetes, anxiety presents  with chief complaint of a laceration to his right upper arm.  He states this happened about 1 hour prior to arrival, states that he was moving a piece of metal and unfortunately cut his arm, states he has a horizontal laceration across his mid upper arm, on the posterior aspect bleeding is controlled, he denies any paresthesia or weakness moving down his arm, still able to move all fingers wrist and elbow without difficulty, he states he has some tenderness around the cut but has no other complaints.  He is a diabetic but is controlled, he does not remember the last time his tetanus shot, he has no other complaints at this time.  Patient's wife is at bedside and she was able to validate the story.  Home Medications Prior to Admission medications   Medication Sig Start Date End Date Taking? Authorizing Provider  cephALEXin (KEFLEX) 500 MG capsule Take 1 capsule (500 mg total) by mouth 2 (two) times daily for 7 days. 08/18/21 08/25/21 Yes Carroll Sage, PA-C  naproxen (NAPROSYN) 500 MG tablet Take 1 tablet (500 mg total) by mouth 2 (two) times daily with a meal. 02/20/17   Darreld Mclean, MD  oxyCODONE-acetaminophen (PERCOCET) 7.5-325 MG tablet Take 1 tablet by mouth every 6 (six) hours as needed for moderate pain or severe pain (Must last 14 days.  Do not take and drive a car or operate machinery). 04/05/17   Darreld Mclean, MD  oxyCODONE-acetaminophen (PERCOCET) 7.5-325 MG tablet One tablet every four hours as needed for acute pain.  Five day limit consistent with [place] Stature. 05/01/17   Darreld Mclean, MD      Allergies    Patient has no known allergies.    Review of Systems   Review of Systems   Constitutional:  Negative for chills and fever.  Respiratory:  Negative for shortness of breath.   Cardiovascular:  Negative for chest pain.  Gastrointestinal:  Negative for abdominal pain.  Skin:  Positive for wound.  Neurological:  Negative for headaches.   Physical Exam Updated Vital Signs BP (!) 181/108 (BP Location: Right Arm)    Pulse (!) 58    Temp 97.8 F (36.6 C) (Oral)    Resp 17    Ht 6\' 2"  (1.88 m)    Wt 106.6 kg    SpO2 97%    BMI 30.17 kg/m  Physical Exam Vitals and nursing note reviewed.  Constitutional:      General: He is not in acute distress.    Appearance: He is not ill-appearing.  HENT:     Head: Normocephalic and atraumatic.     Nose: No congestion.  Eyes:     Conjunctiva/sclera: Conjunctivae normal.  Cardiovascular:     Rate and Rhythm: Normal rate and regular rhythm.     Pulses: Normal pulses.  Pulmonary:     Effort: Pulmonary effort is normal.  Musculoskeletal:     Comments: Patient has a approximately 5 cm horizontal laceration on the posterior mid aspect of the right upper arm, it gapes with movement, no jagged edges, approximately 2 mm, no tendon damage, no foreign bodies noted.  He has full range of motion his fingers wrist  and elbow neurovascular fully intact  Skin:    General: Skin is warm and dry.  Neurological:     Mental Status: He is alert.  Psychiatric:        Mood and Affect: Mood normal.    ED Results / Procedures / Treatments   Labs (all labs ordered are listed, but only abnormal results are displayed) Labs Reviewed - No data to display  EKG None  Radiology No results found.  Procedures .Marland KitchenLaceration Repair  Date/Time: 08/18/2021 2:34 PM Performed by: Carroll Sage, PA-C Authorized by: Carroll Sage, PA-C   Consent:    Consent obtained:  Verbal   Consent given by:  Patient   Risks discussed:  Infection, pain, retained foreign body, need for additional repair, poor cosmetic result, tendon damage, vascular  damage, poor wound healing and nerve damage   Alternatives discussed:  No treatment, delayed treatment, observation and referral Universal protocol:    Patient identity confirmed:  Verbally with patient Anesthesia:    Anesthesia method:  Local infiltration   Local anesthetic:  Lidocaine 2% WITH epi Laceration details:    Location:  Shoulder/arm   Shoulder/arm location:  R upper arm   Length (cm):  5   Depth (mm):  2 Pre-procedure details:    Preparation:  Patient was prepped and draped in usual sterile fashion Exploration:    Limited defect created (wound extended): no     Wound exploration: wound explored through full range of motion and entire depth of wound visualized     Contaminated: no   Treatment:    Area cleansed with:  Saline   Amount of cleaning:  Extensive   Irrigation solution:  Sterile saline   Visualized foreign bodies/material removed: no     Debridement:  None Skin repair:    Repair method:  Sutures   Suture size:  4-0   Suture material:  Prolene   Suture technique:  Simple interrupted   Number of sutures:  5 Approximation:    Approximation:  Close Post-procedure details:    Dressing:  Non-adherent dressing   Procedure completion:  Tolerated well, no immediate complications    Medications Ordered in ED Medications  lidocaine-EPINEPHrine (XYLOCAINE W/EPI) 2 %-1:200000 (PF) injection 10 mL (10 mLs Infiltration Given 08/18/21 1426)  Tdap (BOOSTRIX) injection 0.5 mL (0.5 mLs Intramuscular Given 08/18/21 1423)    ED Course/ Medical Decision Making/ A&P                           Medical Decision Making Risk Prescription drug management.   This patient presents to the ED for concern of right arm laceration, this involves an extensive number of treatment options, and is a complaint that carries with it a high risk of complications and morbidity.  The differential diagnosis includes foreign body, tendon damage,    Additional history obtained:  Additional  history obtained from wife who is at bedside  Co morbidities that complicate the patient evaluation  Diabetes  Social Determinants of Health:  N/A    Lab Tests:  I Ordered, and personally interpreted labs.  The pertinent results include: N/A   Imaging Studies ordered:  I ordered imaging studies including N/A    Cardiac Monitoring:  The patient was maintained on a cardiac monitor.  I personally viewed and interpreted the cardiac monitored which showed an underlying rhythm of: N/A   Medicines ordered and prescription drug management:  I ordered medication including lidocaine wound repair  I have reviewed the patients home medicines and have made adjustments as needed  Reevaluation:  Will recommend suturing to decrease infection risk and to assist with the healing process.  Patient was agreeable with this and tolerated the procedure well.  He received 5 sutures.  Neurovascular was fully intact after the procedure.  Test Considered:  Considered x-ray of the right humerus but will defer as wound is only 2 mm in depth there is no tendon damage, he has full range of motion in his fingers wrist and elbow, no foreign body present.    Rule out low suspicion for  tendon damage as area was palpated no gross defects noted, he had full range of motion in all joints of his right arm.  Low suspicion for compartment syndrome as area was palpated it was soft to the touch, neurovascular fully intact before and after the procedure it was noted that patient had elevated blood pressure, he is not having any symptoms headaches change in vision paresthesia weakness of the lower extremities, no chest pain abdominal pain, likely this is elevated secondary due to pain.    Dispostion and problem list  After consideration of the diagnostic results and the patients response to treatment, I feel that the patent would benefit from discharge.  Laceration-patient received a total of 5 sutures, due  to his diabetic state will add him on antibiotics, will have him come back in next 10 to 12 days for suture removal, given strict return precautions.  Patient is also updated on his tetanus shot.            Final Clinical Impression(s) / ED Diagnoses Final diagnoses:  Laceration of right upper extremity, initial encounter    Rx / DC Orders ED Discharge Orders          Ordered    cephALEXin (KEFLEX) 500 MG capsule  2 times daily        08/18/21 1434              Barnie Del 08/18/21 1437    Eber Hong, MD 08/19/21 [phone]

## 2021-08-24 ENCOUNTER — Ambulatory Visit (HOSPITAL_COMMUNITY)
Admission: RE | Admit: 2021-08-24 | Discharge: 2021-08-24 | Disposition: A | Discharge status: Home / Self Care | Payer: Medicare Other | Source: Ambulatory Visit | Attending: Nurse Practitioner | Admitting: Nurse Practitioner

## 2021-08-24 ENCOUNTER — Other Ambulatory Visit: Payer: Self-pay

## 2021-08-24 DIAGNOSIS — M542 Cervicalgia: Secondary | ICD-10-CM | POA: Insufficient documentation

## 2021-10-10 ENCOUNTER — Encounter (HOSPITAL_COMMUNITY): Payer: Self-pay | Admitting: *Deleted

## 2021-10-10 ENCOUNTER — Other Ambulatory Visit: Payer: Self-pay

## 2021-10-10 ENCOUNTER — Inpatient Hospital Stay (HOSPITAL_COMMUNITY)
Admission: EM | Admit: 2021-10-10 | Discharge: 2021-10-12 | DRG: 439 | Disposition: A | Discharge status: Home / Self Care | Payer: Medicare Other | Attending: Internal Medicine | Admitting: Internal Medicine

## 2021-10-10 ENCOUNTER — Emergency Department (HOSPITAL_COMMUNITY): Payer: Medicare Other

## 2021-10-10 DIAGNOSIS — K859 Acute pancreatitis without necrosis or infection, unspecified: Secondary | ICD-10-CM | POA: Diagnosis present

## 2021-10-10 DIAGNOSIS — G8929 Other chronic pain: Secondary | ICD-10-CM | POA: Diagnosis present

## 2021-10-10 DIAGNOSIS — M549 Dorsalgia, unspecified: Secondary | ICD-10-CM | POA: Diagnosis present

## 2021-10-10 DIAGNOSIS — Z7984 Long term (current) use of oral hypoglycemic drugs: Secondary | ICD-10-CM

## 2021-10-10 DIAGNOSIS — K852 Alcohol induced acute pancreatitis without necrosis or infection: Secondary | ICD-10-CM | POA: Diagnosis not present

## 2021-10-10 DIAGNOSIS — E119 Type 2 diabetes mellitus without complications: Secondary | ICD-10-CM

## 2021-10-10 DIAGNOSIS — K219 Gastro-esophageal reflux disease without esophagitis: Secondary | ICD-10-CM

## 2021-10-10 DIAGNOSIS — F1010 Alcohol abuse, uncomplicated: Secondary | ICD-10-CM | POA: Diagnosis present

## 2021-10-10 DIAGNOSIS — Z6828 Body mass index (BMI) 28.0-28.9, adult: Secondary | ICD-10-CM

## 2021-10-10 DIAGNOSIS — Q453 Other congenital malformations of pancreas and pancreatic duct: Secondary | ICD-10-CM

## 2021-10-10 DIAGNOSIS — E663 Overweight: Secondary | ICD-10-CM | POA: Diagnosis present

## 2021-10-10 LAB — BASIC METABOLIC PANEL
Anion gap: 9 (ref 5–15)
BUN: 21 mg/dL — ABNORMAL HIGH (ref 6–20)
CO2: 24 mmol/L (ref 22–32)
Calcium: 9.3 mg/dL (ref 8.9–10.3)
Chloride: 104 mmol/L (ref 98–111)
Creatinine, Ser: 0.89 mg/dL (ref 0.61–1.24)
GFR, Estimated: >60 mL/min (ref >60)
Glucose, Bld: 135 mg/dL — ABNORMAL HIGH (ref 70–99)
Potassium: 3.8 mmol/L (ref 3.5–5.1)
Sodium: 137 mmol/L (ref 135–145)

## 2021-10-10 LAB — CBC
HCT: 51.1 % (ref 39.0–52.0)
Hemoglobin: 17.4 g/dL — ABNORMAL HIGH (ref 13.0–17.0)
MCH: 32 pg (ref 26.0–34.0)
MCHC: 34.1 g/dL (ref 30.0–36.0)
MCV: 94.1 fL (ref 80.0–100.0)
Platelets: 263 10*3/uL (ref 150–400)
RBC: 5.43 MIL/uL (ref 4.22–5.81)
RDW: 13.2 % (ref 11.5–15.5)
WBC: 10.2 10*3/uL (ref 4.0–10.5)
nRBC: 0 % (ref 0.0–0.2)

## 2021-10-10 LAB — HEPATIC FUNCTION PANEL
ALT: 50 U/L — ABNORMAL HIGH (ref 0–44)
AST: 33 U/L (ref 15–41)
Albumin: 4.4 g/dL (ref 3.5–5.0)
Alkaline Phosphatase: 97 U/L (ref 38–126)
Bilirubin, Direct: 0.1 mg/dL (ref 0.0–0.2)
Indirect Bilirubin: 0.5 mg/dL (ref 0.3–0.9)
Total Bilirubin: 0.6 mg/dL (ref 0.3–1.2)
Total Protein: 8.4 g/dL — ABNORMAL HIGH (ref 6.5–8.1)

## 2021-10-10 LAB — TROPONIN I (HIGH SENSITIVITY)
Troponin I (High Sensitivity): 6 ng/L (ref <18)
Troponin I (High Sensitivity): 6 ng/L (ref <18)

## 2021-10-10 LAB — LIPASE, BLOOD: Lipase: 359 U/L — ABNORMAL HIGH (ref 11–51)

## 2021-10-10 MED ORDER — IOHEXOL 300 MG/ML  SOLN
100.0000 mL | Freq: Once | INTRAMUSCULAR | Status: AC | PRN
  Administered 2021-10-10: 100 mL via INTRAVENOUS

## 2021-10-10 MED ORDER — FAMOTIDINE IN NACL 20-0.9 MG/50ML-% IV SOLN
20.0000 mg | Freq: Once | INTRAVENOUS | Status: AC
  Administered 2021-10-10: 20 mg via INTRAVENOUS
  Filled 2021-10-10: qty 50

## 2021-10-10 MED ORDER — ONDANSETRON HCL 4 MG/2ML IJ SOLN
4.0000 mg | Freq: Once | INTRAMUSCULAR | Status: AC
  Administered 2021-10-10: 4 mg via INTRAVENOUS
  Filled 2021-10-10: qty 2

## 2021-10-10 MED ORDER — LIDOCAINE VISCOUS HCL 2 % MT SOLN
15.0000 mL | Freq: Once | OROMUCOSAL | Status: AC
  Administered 2021-10-10: 15 mL via ORAL
  Filled 2021-10-10: qty 15

## 2021-10-10 MED ORDER — OXYCODONE-ACETAMINOPHEN 5-325 MG PO TABS
2.0000 | ORAL_TABLET | Freq: Once | ORAL | Status: AC
  Administered 2021-10-11: 2 via ORAL
  Filled 2021-10-10: qty 2

## 2021-10-10 MED ORDER — LACTATED RINGERS IV BOLUS
1000.0000 mL | Freq: Once | INTRAVENOUS | Status: AC
Start: 2021-10-10 — End: 2021-10-11
  Administered 2021-10-11: 1000 mL via INTRAVENOUS

## 2021-10-10 MED ORDER — LACTATED RINGERS IV SOLN: INTRAVENOUS | Status: DC

## 2021-10-10 MED ORDER — ALUM & MAG HYDROXIDE-SIMETH 200-200-20 MG/5ML PO SUSP
30.0000 mL | Freq: Once | ORAL | Status: AC
Start: 2021-10-10 — End: 2021-10-10
  Administered 2021-10-10: 30 mL via ORAL
  Filled 2021-10-10: qty 30

## 2021-10-10 NOTE — ED Triage Notes (Signed)
Pt c/o mid sternal chest pain that started this am; pt has had lots of belching ?

## 2021-10-10 NOTE — ED Notes (Signed)
Patient transported to CT 

## 2021-10-10 NOTE — ED Notes (Signed)
Pt is hypertensive. B/P 190/99. Pt states the pain in his chest has subsided, but he is now c/o epigastric pain that comes and goes ?
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
## 2021-10-11 ENCOUNTER — Encounter (HOSPITAL_COMMUNITY): Payer: Self-pay | Admitting: Family Medicine

## 2021-10-11 ENCOUNTER — Inpatient Hospital Stay (HOSPITAL_COMMUNITY): Payer: Medicare Other

## 2021-10-11 DIAGNOSIS — G8929 Other chronic pain: Secondary | ICD-10-CM | POA: Diagnosis present

## 2021-10-11 DIAGNOSIS — K852 Alcohol induced acute pancreatitis without necrosis or infection: Secondary | ICD-10-CM

## 2021-10-11 DIAGNOSIS — F1010 Alcohol abuse, uncomplicated: Secondary | ICD-10-CM | POA: Diagnosis present

## 2021-10-11 DIAGNOSIS — Q453 Other congenital malformations of pancreas and pancreatic duct: Secondary | ICD-10-CM | POA: Diagnosis not present

## 2021-10-11 DIAGNOSIS — E663 Overweight: Secondary | ICD-10-CM | POA: Diagnosis present

## 2021-10-11 DIAGNOSIS — Z6828 Body mass index (BMI) 28.0-28.9, adult: Secondary | ICD-10-CM | POA: Diagnosis not present

## 2021-10-11 DIAGNOSIS — E119 Type 2 diabetes mellitus without complications: Secondary | ICD-10-CM | POA: Diagnosis present

## 2021-10-11 DIAGNOSIS — Z7984 Long term (current) use of oral hypoglycemic drugs: Secondary | ICD-10-CM | POA: Diagnosis not present

## 2021-10-11 DIAGNOSIS — K219 Gastro-esophageal reflux disease without esophagitis: Secondary | ICD-10-CM | POA: Diagnosis present

## 2021-10-11 DIAGNOSIS — M549 Dorsalgia, unspecified: Secondary | ICD-10-CM | POA: Diagnosis present

## 2021-10-11 DIAGNOSIS — K859 Acute pancreatitis without necrosis or infection, unspecified: Secondary | ICD-10-CM | POA: Diagnosis present

## 2021-10-11 LAB — COMPREHENSIVE METABOLIC PANEL
ALT: 40 U/L (ref 0–44)
AST: 33 U/L (ref 15–41)
Albumin: 3.7 g/dL (ref 3.5–5.0)
Alkaline Phosphatase: 82 U/L (ref 38–126)
Anion gap: 6 (ref 5–15)
BUN: 16 mg/dL (ref 6–20)
CO2: 24 mmol/L (ref 22–32)
Calcium: 8.6 mg/dL — ABNORMAL LOW (ref 8.9–10.3)
Chloride: 109 mmol/L (ref 98–111)
Creatinine, Ser: 0.81 mg/dL (ref 0.61–1.24)
GFR, Estimated: >60 mL/min (ref >60)
Glucose, Bld: 105 mg/dL — ABNORMAL HIGH (ref 70–99)
Potassium: 4.2 mmol/L (ref 3.5–5.1)
Sodium: 139 mmol/L (ref 135–145)
Total Bilirubin: 0.7 mg/dL (ref 0.3–1.2)
Total Protein: 7.2 g/dL (ref 6.5–8.1)

## 2021-10-11 LAB — GLUCOSE, CAPILLARY
Glucose-Capillary: 104 mg/dL — ABNORMAL HIGH (ref 70–99)
Glucose-Capillary: 109 mg/dL — ABNORMAL HIGH (ref 70–99)
Glucose-Capillary: 109 mg/dL — ABNORMAL HIGH (ref 70–99)
Glucose-Capillary: 123 mg/dL — ABNORMAL HIGH (ref 70–99)
Glucose-Capillary: 122 mg/dL — ABNORMAL HIGH (ref 70–99)

## 2021-10-11 LAB — CBC
HCT: 44.8 % (ref 39.0–52.0)
Hemoglobin: 15.7 g/dL (ref 13.0–17.0)
MCH: 33.5 pg (ref 26.0–34.0)
MCHC: 35 g/dL (ref 30.0–36.0)
MCV: 95.5 fL (ref 80.0–100.0)
Platelets: 235 10*3/uL (ref 150–400)
RBC: 4.69 MIL/uL (ref 4.22–5.81)
RDW: 13.3 % (ref 11.5–15.5)
WBC: 7.2 10*3/uL (ref 4.0–10.5)
nRBC: 0 % (ref 0.0–0.2)

## 2021-10-11 LAB — HEMOGLOBIN A1C
Hgb A1c MFr Bld: 5.8 % — ABNORMAL HIGH (ref 4.8–5.6)
Mean Plasma Glucose: 119.76 mg/dL

## 2021-10-11 LAB — TRIGLYCERIDES: Triglycerides: 80 mg/dL (ref <150)

## 2021-10-11 LAB — MRSA NEXT GEN BY PCR, NASAL: MRSA by PCR Next Gen: NOT DETECTED

## 2021-10-11 LAB — MAGNESIUM: Magnesium: 2.2 mg/dL (ref 1.7–2.4)

## 2021-10-11 LAB — HIV ANTIBODY (ROUTINE TESTING W REFLEX): HIV Screen 4th Generation wRfx: NONREACTIVE

## 2021-10-11 MED ORDER — LIDOCAINE VISCOUS HCL 2 % MT SOLN
15.0000 mL | Freq: Once | OROMUCOSAL | Status: AC
  Administered 2021-10-11: 15 mL via ORAL
  Filled 2021-10-11: qty 15

## 2021-10-11 MED ORDER — SODIUM CHLORIDE 0.9 % IV SOLN: INTRAVENOUS | Status: AC

## 2021-10-11 MED ORDER — FOLIC ACID 1 MG PO TABS
1.0000 mg | ORAL_TABLET | Freq: Every day | ORAL | Status: DC
  Administered 2021-10-11 – 2021-10-12 (×2): 1 mg via ORAL
  Filled 2021-10-11 (×2): qty 1

## 2021-10-11 MED ORDER — LORAZEPAM 2 MG/ML IJ SOLN: 0.0000 mg | Freq: Four times a day (QID) | INTRAMUSCULAR | Status: DC

## 2021-10-11 MED ORDER — SODIUM CHLORIDE 0.9 % IV SOLN: INTRAVENOUS | Status: DC

## 2021-10-11 MED ORDER — ENOXAPARIN SODIUM 40 MG/0.4ML IJ SOSY
40.0000 mg | PREFILLED_SYRINGE | INTRAMUSCULAR | Status: DC
  Administered 2021-10-11 – 2021-10-12 (×2): 40 mg via SUBCUTANEOUS
  Filled 2021-10-11 (×2): qty 0.4

## 2021-10-11 MED ORDER — ADULT MULTIVITAMIN W/MINERALS CH
1.0000 | ORAL_TABLET | Freq: Every day | ORAL | Status: DC
  Administered 2021-10-11 – 2021-10-12 (×2): 1 via ORAL
  Filled 2021-10-11 (×2): qty 1

## 2021-10-11 MED ORDER — GADOBUTROL 1 MMOL/ML IV SOLN
10.0000 mL | Freq: Once | INTRAVENOUS | Status: AC | PRN
  Administered 2021-10-11: 10 mL via INTRAVENOUS

## 2021-10-11 MED ORDER — LORAZEPAM 2 MG/ML IJ SOLN
1.0000 mg | INTRAMUSCULAR | Status: DC | PRN
  Administered 2021-10-11: 2 mg via INTRAVENOUS
  Filled 2021-10-11: qty 1

## 2021-10-11 MED ORDER — ALUM & MAG HYDROXIDE-SIMETH 200-200-20 MG/5ML PO SUSP
30.0000 mL | Freq: Once | ORAL | Status: AC
  Administered 2021-10-11: 30 mL via ORAL
  Filled 2021-10-11: qty 30

## 2021-10-11 MED ORDER — SODIUM CHLORIDE 0.9 % IV BOLUS
1000.0000 mL | Freq: Once | INTRAVENOUS | Status: AC
Start: 2021-10-11 — End: 2021-10-11
  Administered 2021-10-11: 1000 mL via INTRAVENOUS

## 2021-10-11 MED ORDER — INSULIN ASPART 100 UNIT/ML IJ SOLN: 0.0000 [IU] | INTRAMUSCULAR | Status: DC

## 2021-10-11 MED ORDER — HYDROMORPHONE HCL 1 MG/ML IJ SOLN: 1.0000 mg | INTRAMUSCULAR | Status: DC | PRN

## 2021-10-11 MED ORDER — HYDROMORPHONE HCL 1 MG/ML IJ SOLN
0.5000 mg | INTRAMUSCULAR | Status: DC | PRN
  Administered 2021-10-11 – 2021-10-12 (×5): 0.5 mg via INTRAVENOUS
  Filled 2021-10-11 (×5): qty 0.5

## 2021-10-11 MED ORDER — HYDROMORPHONE HCL 1 MG/ML IJ SOLN
1.0000 mg | Freq: Once | INTRAMUSCULAR | Status: AC
  Administered 2021-10-11: 1 mg via INTRAVENOUS
  Filled 2021-10-11: qty 1

## 2021-10-11 MED ORDER — ONDANSETRON HCL 4 MG/2ML IJ SOLN
4.0000 mg | Freq: Four times a day (QID) | INTRAMUSCULAR | Status: DC | PRN
  Administered 2021-10-11 – 2021-10-12 (×4): 4 mg via INTRAVENOUS
  Filled 2021-10-11 (×4): qty 2

## 2021-10-11 MED ORDER — THIAMINE HCL 100 MG PO TABS
100.0000 mg | ORAL_TABLET | Freq: Every day | ORAL | Status: DC
  Administered 2021-10-11 – 2021-10-12 (×2): 100 mg via ORAL
  Filled 2021-10-11 (×2): qty 1

## 2021-10-11 MED ORDER — LORAZEPAM 1 MG PO TABS
1.0000 mg | ORAL_TABLET | ORAL | Status: DC | PRN
  Administered 2021-10-11: 1 mg via ORAL
  Filled 2021-10-11: qty 1

## 2021-10-11 MED ORDER — FENTANYL CITRATE PF 50 MCG/ML IJ SOSY
100.0000 ug | PREFILLED_SYRINGE | Freq: Once | INTRAMUSCULAR | Status: AC
  Administered 2021-10-11: 50 ug via INTRAVENOUS
  Filled 2021-10-11: qty 2

## 2021-10-11 MED ORDER — HYDROMORPHONE HCL 1 MG/ML IJ SOLN
1.0000 mg | INTRAMUSCULAR | Status: DC | PRN
  Administered 2021-10-11 (×2): 1 mg via INTRAVENOUS
  Filled 2021-10-11 (×2): qty 1

## 2021-10-11 MED ORDER — PANTOPRAZOLE SODIUM 40 MG IV SOLR
40.0000 mg | Freq: Two times a day (BID) | INTRAVENOUS | Status: DC
  Administered 2021-10-11 – 2021-10-12 (×3): 40 mg via INTRAVENOUS
  Filled 2021-10-11 (×3): qty 10

## 2021-10-11 MED ORDER — THIAMINE HCL 100 MG/ML IJ SOLN: 100.0000 mg | Freq: Every day | INTRAMUSCULAR | Status: DC

## 2021-10-11 MED ORDER — LORAZEPAM 2 MG/ML IJ SOLN: 0.0000 mg | Freq: Two times a day (BID) | INTRAMUSCULAR | Status: DC

## 2021-10-11 NOTE — Plan of Care (Signed)

## 2021-10-11 NOTE — Progress Notes (Signed)
?  Transition of Care (TOC) Screening Note ? ? ?Patient Details  ?Name: Nathan Sanchez ?Date of Birth: 1960/11/23 ? ? ?Transition of Care (TOC) CM/SW Contact:    ?Anselmo Reihl D, LCSW ?Phone Number: ?10/11/2021, 1:37 PM ? ? ? ?Transition of Care Department [facility]) has reviewed patient and no TOC needs have been identified at this time. We will continue to monitor patient advancement through interdisciplinary progression rounds. If new patient transition needs arise, please place a TOC consult. ?  ?

## 2021-10-11 NOTE — Progress Notes (Signed)
?PROGRESS NOTE ? ? ? ?Nathan Sanchez  ZOX:[id number] DOB: 09/18/1960 DOA: 10/10/2021 ?[facility] ? ? ?Brief Narrative:  ?Nathan BargesLonnie D Sanchez is a pleasant 61 y.o. male with medical history significant for chronic back pain, recent diagnosis of type 2 diabetes mellitus, and excessive alcohol use, now presenting to the emergency department with epigastric pain.  Patient admitted for concerns of acute pancreatitis, imaging thus far remarkable for common bile duct dilation discussed with GI we will order MRCP and follow along clinically.  Certainly if obstructive choledocholithiasis noted GI consult would be warranted for removal.  Otherwise continue to advance diet as tolerated, IV fluids and pain medications ongoing. ? ?Assessment & Plan: ?  ?Principal Problem: ?  Acute pancreatitis ?Active Problems: ?  Excessive drinking of alcohol ?  Diabetes mellitus type II, non insulin dependent (HCC) ? ? ?Acute pancreatitis, alcoholic versus questionably obstructive in nature, POA ?-Lipase minimally elevated, follow trend ?-Right upper quadrant ultrasound remarkable for common bile duct dilation without overt obstruction ?-Unclear if previously passed stone or radial lucent stone noted, MRCP pending ?-Patient does report moderate drinking on average approximately 6 beers fourth of alcohol per day (consumes two 4 Loco's around 15% alcohol each) ?-Continue IV fluids, advance diet as tolerated, continue pain control ?-Consider GI consult if MRCP remarkable for obstructive process ?  ?Alcohol abuse  ?-As above, follow for signs or symptoms of withdrawal, appears to be asymptomatic at this time ? ?Type II DM  ?- Recent diagnosis ?-Follow sliding scale, hypoglycemic protocol in the setting of above  ?  ?DVT prophylaxis: Lovenox  ?Code Status: Full  ?Level of Care: Level of care: Med-Surg ? ?Status is: Inpatient ? ?Dispo: The patient is from: Home ?             Anticipated d/c is to: Home ?             Anticipated d/c date  is: 24 to 48 hours ?             Patient currently not medically stable for discharge ? ?Consultants:  ?None, GI sidelined for possible follow-up pending MRCP ? ?Procedures:  ?None ? ?Antimicrobials:  ?None ? ?Subjective: ?No acute issues or events overnight abdominal pain improving, looking forward to increasing p.o. intake as tolerated otherwise denies headache fever chills chest pain shortness of breath ? ?Objective: ?Vitals:  ? 10/11/21 0230 10/11/21 0300 10/11/21 0400 10/11/21 0522  ?BP: (!) 166/101 (!) 156/88 (!) 155/80 135/87  ?Pulse: (!) 56 (!) 54 65 (!) 54  ?Resp: 18 17 18 18   ?Temp:   98 ?F (36.7 ?C) 97.9 ?F (36.6 ?C)  ?TempSrc:    Oral  ?SpO2: 97% 97% 95% 97%  ?Weight:      ?Height:      ? ? ?Intake/Output Summary (Last 24 hours) at 10/11/2021 0732 ?Last data filed at 10/11/2021 [id number] ?Gross per 24 hour  ?Intake 1235.29 ml  ?Output --  ?Net 1235.29 ml  ? ?Filed Weights  ? 10/10/21 1715  ?Weight: 100.7 kg  ? ? ?Examination: ? ?General exam: Appears calm and comfortable  ?Respiratory system: Clear to auscultation. Respiratory effort normal. ?Cardiovascular system: S1 & S2 heard, RRR. No JVD, murmurs, rubs, gallops or clicks. No pedal edema. ?Gastrointestinal system: Abdomen is nondistended, soft and nontender. No organomegaly or masses felt. Normal bowel sounds heard. ?Central nervous system: Alert and oriented. No focal neurological deficits. ?Extremities: Symmetric 5 x 5 power. ?Skin: No rashes, lesions or ulcers ?Psychiatry: Judgement and  insight appear normal. Mood & affect appropriate.  ? ? ? ?Data Reviewed: I have personally reviewed following labs and imaging studies ? ?CBC: ?Recent Labs  ?Lab 10/10/21 ?1739 10/11/21 ?0092  ?WBC 10.2 7.2  ?HGB 17.4* 15.7  ?HCT 51.1 44.8  ?MCV 94.1 95.5  ?PLT 263 235  ? ?Basic Metabolic Panel: ?Recent Labs  ?Lab 10/10/21 ?1739 10/11/21 ?3300  ?NA 137 139  ?K 3.8 4.2  ?CL 104 109  ?CO2 24 24  ?GLUCOSE 135* 105*  ?BUN 21* 16  ?CREATININE 0.89 0.81  ?CALCIUM 9.3 8.6*  ?MG   --  2.2  ? ?GFR: ?Estimated Creatinine Clearance: 122.9 mL/min (by C-G formula based on SCr of 0.81 mg/dL). ?Liver Function Tests: ?Recent Labs  ?Lab 10/10/21 ?1937 10/11/21 ?7622  ?AST 33 33  ?ALT 50* 40  ?ALKPHOS 97 82  ?BILITOT 0.6 0.7  ?PROT 8.4* 7.2  ?ALBUMIN 4.4 3.7  ? ?Recent Labs  ?Lab 10/10/21 ?1937  ?LIPASE 359*  ? ?No results for input(s): AMMONIA in the last 168 hours. ?Coagulation Profile: ?No results for input(s): INR, PROTIME in the last 168 hours. ?Cardiac Enzymes: ?No results for input(s): CKTOTAL, CKMB, CKMBINDEX, TROPONINI in the last 168 hours. ?BNP (last 3 results) ?No results for input(s): PROBNP in the last 8760 hours. ?HbA1C: ?No results for input(s): HGBA1C in the last 72 hours. ?CBG: ?Recent Labs  ?Lab 10/11/21 ?6333  ?GLUCAP 104*  ? ?Lipid Profile: ?Recent Labs  ?  10/11/21 ?5456  ?TRIG 80  ? ?Thyroid Function Tests: ?No results for input(s): TSH, T4TOTAL, FREET4, T3FREE, THYROIDAB in the last 72 hours. ?Anemia Panel: ?No results for input(s): VITAMINB12, FOLATE, FERRITIN, TIBC, IRON, RETICCTPCT in the last 72 hours. ?Sepsis Labs: ?No results for input(s): PROCALCITON, LATICACIDVEN in the last 168 hours. ? ?No results found for this or any previous visit (from the past 240 hour(s)).  ? ? ? ? ? ?Radiology Studies: ?DG Chest 2 View ? ?Result Date: 10/10/2021 ?CLINICAL DATA:  Chest pain. EXAM: CHEST - 2 VIEW COMPARISON:  Chest x-ray 04/28/2014. FINDINGS: The heart size and mediastinal contours are within normal limits. Both lungs are clear. Cervical spinal fusion plate is present. No acute fractures are seen. IMPRESSION: No active cardiopulmonary disease. Electronically Signed   By: Darliss Cheney M.D.   On: 10/10/2021 17:45  ? ?CT ABDOMEN PELVIS W CONTRAST ? ?Result Date: 10/11/2021 ?CLINICAL DATA:  Pancreatitis, acute, severe. Mid upper abdominal pain EXAM: CT ABDOMEN AND PELVIS WITH CONTRAST TECHNIQUE: Multidetector CT imaging of the abdomen and pelvis was performed using the standard  protocol following bolus administration of intravenous contrast. RADIATION DOSE REDUCTION: This exam was performed according to the departmental dose-optimization program which includes automated exposure control, adjustment of the mA and/or kV according to patient size and/or use of iterative reconstruction technique. CONTRAST:  OMNIPAQUE IOHEXOL 300 MG/ML  SOLN COMPARISON:  07/29/2010 FINDINGS: Lower chest: No acute abnormality Hepatobiliary: No focal hepatic abnormality. Gallbladder unremarkable. Pancreas: There is stranding noted around the pancreas, particularly pancreatic head and adjacent descending duodenum. Fluid tracks in the anterior pararenal spaces bilaterally. Findings most compatible with acute pancreatitis. No ductal dilatation or focal pancreatic lesion. Spleen: No focal abnormality.  Normal size. Adrenals/Urinary Tract: No adrenal abnormality. No focal renal abnormality. No stones or hydronephrosis. Urinary bladder is unremarkable. Stomach/Bowel: Stomach, large and small bowel grossly unremarkable. Vascular/Lymphatic: Aortic atherosclerosis. No evidence of aneurysm or adenopathy. Reproductive: No visible focal abnormality. Other: No free fluid or free air. Musculoskeletal: No acute bony abnormality. IMPRESSION:  Stranding noted around the pancreas, particularly pancreatic head and adjacent duodenum with fluid tracking in the pararenal spaces bilaterally. Findings most compatible with acute pancreatitis. Electronically Signed   By: Charlett Nose M.D.   On: 10/11/2021 00:13   ? ? ? ? ? ?Scheduled Meds: ? enoxaparin (LOVENOX) injection  40 mg Subcutaneous Q24H  ? folic acid  1 mg Oral Daily  ? insulin aspart  0-6 Units Subcutaneous Q4H  ? LORazepam  0-4 mg Intravenous Q6H  ? Followed by  ? [START ON 10/13/2021] LORazepam  0-4 mg Intravenous Q12H  ? multivitamin with minerals  1 tablet Oral Daily  ? thiamine  100 mg Oral Daily  ? Or  ? thiamine  100 mg Intravenous Daily  ? ?Continuous Infusions: ?  sodium chloride 150 mL/hr at 10/11/21 0650  ? ? ? LOS: 0 days  ? ?Time spent: ? ?Azucena Fallen, DO ?[facility] ? ?If 7PM-7AM, please contact night-coverage ?[email or web address] ? ?10/11/2021, 7:32

## 2021-10-11 NOTE — ED Provider Notes (Signed)
I assumed care in signout ?CT findings c/w pancreatitis ?Pt admits to drinking 2 large cans of ETOH/day ?Will admit for ETOH induced pancreatitis ?D/w Dr. Antionette Char for admission ? ?  ?Zadie Rhine, MD ?10/11/21 [phone] ? ?

## 2021-10-11 NOTE — Progress Notes (Signed)
Pt complain of abd 7/10. Received dilaudid @1624 . Family at bedside questioned the frequency of the medication. Explained to family the med was Q4hrs. Family member came to the desk and demand the medication frequency be changed back to Q3hrs. MD notified via page. ?
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
## 2021-10-11 NOTE — ED Notes (Signed)
Report called to Gypsy Balsam on 3A ?

## 2021-10-12 DIAGNOSIS — K219 Gastro-esophageal reflux disease without esophagitis: Secondary | ICD-10-CM

## 2021-10-12 DIAGNOSIS — E663 Overweight: Secondary | ICD-10-CM

## 2021-10-12 DIAGNOSIS — Z6828 Body mass index (BMI) 28.0-28.9, adult: Secondary | ICD-10-CM

## 2021-10-12 LAB — COMPREHENSIVE METABOLIC PANEL
ALT: 31 U/L (ref 0–44)
AST: 25 U/L (ref 15–41)
Albumin: 3.5 g/dL (ref 3.5–5.0)
Alkaline Phosphatase: 75 U/L (ref 38–126)
Anion gap: 6 (ref 5–15)
BUN: 11 mg/dL (ref 6–20)
CO2: 24 mmol/L (ref 22–32)
Calcium: 8.7 mg/dL — ABNORMAL LOW (ref 8.9–10.3)
Chloride: 108 mmol/L (ref 98–111)
Creatinine, Ser: 0.84 mg/dL (ref 0.61–1.24)
GFR, Estimated: >60 mL/min (ref >60)
Glucose, Bld: 100 mg/dL — ABNORMAL HIGH (ref 70–99)
Potassium: 4.3 mmol/L (ref 3.5–5.1)
Sodium: 138 mmol/L (ref 135–145)
Total Bilirubin: 0.7 mg/dL (ref 0.3–1.2)
Total Protein: 6.9 g/dL (ref 6.5–8.1)

## 2021-10-12 LAB — CBC
HCT: 43.2 % (ref 39.0–52.0)
Hemoglobin: 15 g/dL (ref 13.0–17.0)
MCH: 33.6 pg (ref 26.0–34.0)
MCHC: 34.7 g/dL (ref 30.0–36.0)
MCV: 96.6 fL (ref 80.0–100.0)
Platelets: 205 10*3/uL (ref 150–400)
RBC: 4.47 MIL/uL (ref 4.22–5.81)
RDW: 13.2 % (ref 11.5–15.5)
WBC: 6.3 10*3/uL (ref 4.0–10.5)
nRBC: 0 % (ref 0.0–0.2)

## 2021-10-12 LAB — GLUCOSE, CAPILLARY
Glucose-Capillary: 100 mg/dL — ABNORMAL HIGH (ref 70–99)
Glucose-Capillary: 107 mg/dL — ABNORMAL HIGH (ref 70–99)
Glucose-Capillary: 99 mg/dL (ref 70–99)

## 2021-10-12 MED ORDER — THIAMINE HCL 100 MG PO TABS: 100.0000 mg | ORAL_TABLET | Freq: Every day | ORAL | 1 refills | Status: DC

## 2021-10-12 MED ORDER — ACETAMINOPHEN 325 MG PO TABS
650.0000 mg | ORAL_TABLET | Freq: Four times a day (QID) | ORAL | Status: DC | PRN
  Administered 2021-10-12: 650 mg via ORAL
  Filled 2021-10-12: qty 2

## 2021-10-12 MED ORDER — FOLIC ACID 1 MG PO TABS: 1.0000 mg | ORAL_TABLET | Freq: Every day | ORAL | 1 refills | Status: DC

## 2021-10-12 MED ORDER — ONDANSETRON 8 MG PO TBDP: 8.0000 mg | ORAL_TABLET | Freq: Three times a day (TID) | ORAL | 0 refills | Status: DC | PRN

## 2021-10-12 NOTE — Assessment & Plan Note (Signed)
-  In the setting of alcohol consumption. 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 ?-excellent response to fluid resuscitation, antiemetics, analgesics, bowel rest. ?-At discharge able to tolerate diet and not complaining of any nausea/vomiting or significant pain. ?-Patient started on PPI daily and advised to stop alcohol consumption. ?-Low-fat diet encouraged. ?

## 2021-10-12 NOTE — Care Management Important Message (Signed)
Important Message ? ?Patient Details  ?Name: Nathan Sanchez ?MRN: [id number] ?Date of Birth: July 30, 1960 ? ? ?Medicare Important Message Given:  N/A - LOS <3 / Initial given by admissions ? ? ? ? ?Tommy Medal ?10/12/2021, 11:21 AM ?

## 2021-10-12 NOTE — Assessment & Plan Note (Signed)
-  Modified carbohydrate diet has been encouraged ?-Patient advised to maintain adequate hydration and to continue close follow-up with PCP to further adjust hypoglycemic regimen as needed. ?

## 2021-10-12 NOTE — Progress Notes (Signed)
Patient is being discharged. Discharge instructions reviewed including new medications. Patient verbalized full understanding. Family member at bedside is patients ride home.  ?

## 2021-10-12 NOTE — Assessment & Plan Note (Signed)
-  Started on PPI daily ?-Alcohol cessation counseling provided. ?

## 2021-10-12 NOTE — Assessment & Plan Note (Signed)
-   Low calorie diet, portion control and increase physical activity discussed with patient. ?-Body mass index is 28.5 kg/m?Marland Kitchen ? ?

## 2021-10-12 NOTE — Assessment & Plan Note (Signed)
-  Cessation counseling provided ?-No withdrawal symptoms appreciated throughout hospitalization ?-Discharge on daily thiamine and folic acid. ?

## 2021-10-12 NOTE — Discharge Summary (Signed)
?Physician Discharge Summary ?  ?Patient: Nathan Sanchez MRN: [id number] DOB: [birth year]  ?Admit date:     10/10/2021  ?Discharge date: 10/12/21  ?Discharge Physician: Vassie Loll  ? ?PCP: [facility]  ? ?Recommendations at discharge:  ?Repeat basic metabolic panel to follow electrolytes and renal function ?Continue to closely follow patient's CBGs with further adjustment to hypoglycemic regimen as needed. ?Continue assisting with weight management ?Continue assisting patient with alcohol cessation. ? ?Discharge Diagnoses: ?Principal Problem: ?  Acute pancreatitis ?Active Problems: ?  Excessive drinking of alcohol ?  Diabetes mellitus type II, non insulin dependent (HCC) ?  Gastroesophageal reflux disease ?  Overweight with body mass index (BMI) of 28 to 28.9 in adult ? ?Hospital Course: ?As per H&P written by Dr. Antionette Char on 10/11/2025 ?Nathan Sanchez is a pleasant 61 y.o. male with medical history significant for chronic back pain, recent diagnosis of type 2 diabetes mellitus, and excessive alcohol use, now presenting to the emergency department with epigastric pain.  Patient reports that he been in his usual state until waking early in the morning of 10/10/2021 with pain in the epigastrium and lower chest.  There was some radiation to his back and pain that later began to localize more to the epigastrium.  Pain has been constant.  He has never experienced this previously.  He drinks 48 ounces of a 13% alcohol malt liquor daily.  Denies history of withdrawal.  Recently started Jardiance.  Denies history of gallbladder disease.  No fevers. ?  ?ED Course: Upon arrival to the ED, patient is found to be afebrile and saturating well on room air with stable blood pressure.  EKG features sinus rhythm and chest x-ray negative for acute cardiopulmonary disease.  CT of the abdomen and pelvis demonstrated stranding about the pancreas.  CBC with a slight polycythemia and chemistry panel notable for elevated BUN to  creatinine ratio and slight elevation in ALT.  Lipase elevated to 359.  Patient was given 2 L of IV fluids, Zofran, fentanyl, and Dilaudid in the ED. ? ?Assessment and Plan: ?* Acute pancreatitis ?-In the setting of alcohol consumption. 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 ?-excellent response to fluid resuscitation, antiemetics, analgesics, bowel rest. ?-At discharge able to tolerate diet and not complaining of any nausea/vomiting or significant pain. ?-Patient started on PPI daily and advised to stop alcohol consumption. ?-Low-fat diet encouraged. ? ?Overweight with body mass index (BMI) of 28 to 28.9 in adult ?- Low calorie diet, portion control and increase physical activity discussed with patient. ?-Body mass index is 28.5 kg/m?. ? ? ?Gastroesophageal reflux disease ?-Started on PPI daily ?-Alcohol cessation counseling provided. ? ?Diabetes mellitus type II, non insulin dependent (HCC) ?-Modified carbohydrate diet has been encouraged ?-Patient advised to maintain adequate hydration and to continue close follow-up with PCP to further adjust hypoglycemic regimen as needed. ? ?Excessive drinking of alcohol ?-Cessation counseling provided ?-No withdrawal symptoms appreciated throughout hospitalization ?-Discharge on daily thiamine and folic acid. ? ? ?Consultants: None ?Procedures performed: See below for x-ray reports. ?Disposition: Home ?Diet recommendation: Low-fat diet and modified carbohydrates. ? ?DISCHARGE MEDICATION: ?Allergies as of 10/12/2021   ?No Known Allergies ?  ? ?  ?Medication List  ?  ? ?STOP taking these medications   ? ?naproxen 500 MG tablet ?Commonly known as: NAPROSYN ?  ?oxyCODONE-acetaminophen 7.5-325 MG tablet ?Commonly known as: Percocet ?  ? ?  ? ?TAKE these medications   ? ?baclofen 20 MG tablet ?Commonly known  as: LIORESAL ?Take 20 mg by mouth 3 (three) times daily as needed. ?  ?folic acid 1 MG tablet ?Commonly known as: FOLVITE ?Take 1  tablet (1 mg total) by mouth daily. ?Start taking on: October 13, 2021 ?  ?glimepiride 4 MG tablet ?Commonly known as: AMARYL ?Take 4 mg by mouth daily. ?  ?hydrocortisone 2.5 % cream ?Apply topically 2 (two) times daily. ?  ?Jardiance 25 MG Tabs tablet ?Generic drug: empagliflozin ?Take 25 mg by mouth daily. ?  ?ondansetron 8 MG disintegrating tablet ?Commonly known as: ZOFRAN-ODT ?Take 1 tablet (8 mg total) by mouth every 8 (eight) hours as needed for nausea or vomiting. ?  ?Oxycodone HCl 10 MG Tabs ?Take 10 mg by mouth 4 (four) times daily as needed. ?  ?pregabalin 75 MG capsule ?Commonly known as: LYRICA ?Take 75 mg by mouth 2 (two) times daily. ?  ?rOPINIRole 1 MG tablet ?Commonly known as: REQUIP ?Take 1 mg by mouth at bedtime. ?  ?thiamine 100 MG tablet ?Take 1 tablet (100 mg total) by mouth daily. ?Start taking on: October 13, 2021 ?  ?Vitamin D (Ergocalciferol) 1.25 MG (50000 UNIT) Caps capsule ?Commonly known as: DRISDOL ?Take 50,000 Units by mouth once a week. ?  ? ?  ? ? Follow-up Information   ? ? [facility]. Schedule an appointment as soon as possible for a visit in 10 day(s).   ?Contact information: ?[address] ?[place] [address] ?[phone] ? ? ?  ?  ? ?  ?  ? ?  ? ?Discharge Exam: ?Ceasar Mons Weights  ? 10/10/21 1715  ?Weight: 100.7 kg  ? ?General exam: Alert, awake, oriented x 3; no nausea, no vomiting, tolerating diet without problems and expressing minimal abdominal discomfort. ?Respiratory system: Clear to auscultation. Respiratory effort normal. ?Cardiovascular system:RRR. No murmurs, rubs, gallops.  No JVD. ?Gastrointestinal system: Abdomen is obese, nondistended, soft and nontender. No organomegaly or masses felt. Normal bowel sounds heard. ?Central nervous system: Alert and oriented. No focal neurological deficits. ?Extremities: No cyanosis or clubbing. ?Skin: No petechiae. ?Psychiatry: Judgement and insight appear normal. Mood & affect appropriate.  ? ? ?Condition at  discharge: Stable and improved. ? ?The results of significant diagnostics from this hospitalization (including imaging, microbiology, ancillary and laboratory) are listed below for reference.  ? ?Imaging Studies: ?DG Chest 2 View ? ?Result Date: 10/10/2021 ?CLINICAL DATA:  Chest pain. EXAM: CHEST - 2 VIEW COMPARISON:  Chest x-ray 04/28/2014. FINDINGS: The heart size and mediastinal contours are within normal limits. Both lungs are clear. Cervical spinal fusion plate is present. No acute fractures are seen. IMPRESSION: No active cardiopulmonary disease. Electronically Signed   By: Darliss Cheney M.D.   On: 10/10/2021 17:45  ? ?CT ABDOMEN PELVIS W CONTRAST ? ?Result Date: 10/11/2021 ?CLINICAL DATA:  Pancreatitis, acute, severe. Mid upper abdominal pain EXAM: CT ABDOMEN AND PELVIS WITH CONTRAST TECHNIQUE: Multidetector CT imaging of the abdomen and pelvis was performed using the standard protocol following bolus administration of intravenous contrast. RADIATION DOSE REDUCTION: This exam was performed according to the departmental dose-optimization program which includes automated exposure control, adjustment of the mA and/or kV according to patient size and/or use of iterative reconstruction technique. CONTRAST:  OMNIPAQUE IOHEXOL 300 MG/ML  SOLN COMPARISON:  07/29/2010 FINDINGS: Lower chest: No acute abnormality Hepatobiliary: No focal hepatic abnormality. Gallbladder unremarkable. Pancreas: There is stranding noted around the pancreas, particularly pancreatic head and adjacent descending duodenum. Fluid tracks in the anterior pararenal spaces bilaterally. Findings most compatible with  acute pancreatitis. No ductal dilatation or focal pancreatic lesion. Spleen: No focal abnormality.  Normal size. Adrenals/Urinary Tract: No adrenal abnormality. No focal renal abnormality. No stones or hydronephrosis. Urinary bladder is unremarkable. Stomach/Bowel: Stomach, large and small bowel grossly unremarkable.  Vascular/Lymphatic: Aortic atherosclerosis. No evidence of aneurysm or adenopathy. Reproductive: No visible focal abnormality. Other: No free fluid or free air. Musculoskeletal: No acute bony abnormality. IMPRESSION: Billey GoslingStrandi

## 2022-03-08 ENCOUNTER — Ambulatory Visit: Payer: Medicare Other | Admitting: Podiatry

## 2022-03-17 ENCOUNTER — Ambulatory Visit: Payer: Medicare Other | Admitting: Podiatry

## 2022-03-17 ENCOUNTER — Ambulatory Visit (INDEPENDENT_AMBULATORY_CARE_PROVIDER_SITE_OTHER): Payer: Medicare Other

## 2022-03-17 DIAGNOSIS — M7751 Other enthesopathy of right foot: Secondary | ICD-10-CM | POA: Diagnosis not present

## 2022-03-17 DIAGNOSIS — M19071 Primary osteoarthritis, right ankle and foot: Secondary | ICD-10-CM

## 2022-03-17 DIAGNOSIS — M2021 Hallux rigidus, right foot: Secondary | ICD-10-CM
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
## 2022-04-14 ENCOUNTER — Emergency Department (HOSPITAL_COMMUNITY)
Admission: EM | Admit: 2022-04-14 | Discharge: 2022-04-14 | Disposition: A | Discharge status: Home / Self Care | Payer: Medicare Other | Attending: Emergency Medicine | Admitting: Emergency Medicine

## 2022-04-14 ENCOUNTER — Emergency Department (HOSPITAL_COMMUNITY): Payer: Medicare Other

## 2022-04-14 ENCOUNTER — Encounter (HOSPITAL_COMMUNITY): Payer: Self-pay | Admitting: Emergency Medicine

## 2022-04-14 DIAGNOSIS — N179 Acute kidney failure, unspecified: Secondary | ICD-10-CM | POA: Insufficient documentation

## 2022-04-14 DIAGNOSIS — Z79899 Other long term (current) drug therapy: Secondary | ICD-10-CM | POA: Insufficient documentation

## 2022-04-14 DIAGNOSIS — R410 Disorientation, unspecified: Secondary | ICD-10-CM | POA: Diagnosis present

## 2022-04-14 LAB — URINALYSIS, ROUTINE W REFLEX MICROSCOPIC
Bilirubin Urine: NEGATIVE
Glucose, UA: 150 mg/dL — AB
Hgb urine dipstick: NEGATIVE
Ketones, ur: NEGATIVE mg/dL
Leukocytes,Ua: NEGATIVE
Nitrite: NEGATIVE
Protein, ur: NEGATIVE mg/dL
Specific Gravity, Urine: 1.012 (ref 1.005–1.030)
pH: 5 (ref 5.0–8.0)

## 2022-04-14 LAB — CBC WITH DIFFERENTIAL/PLATELET
Abs Immature Granulocytes: 0.02 10*3/uL (ref 0.00–0.07)
Basophils Absolute: 0 10*3/uL (ref 0.0–0.1)
Basophils Relative: 0 %
Eosinophils Absolute: 0.4 10*3/uL (ref 0.0–0.5)
Eosinophils Relative: 5 %
HCT: 39.2 % (ref 39.0–52.0)
Hemoglobin: 13.6 g/dL (ref 13.0–17.0)
Immature Granulocytes: 0 %
Lymphocytes Relative: 20 %
Lymphs Abs: 1.5 10*3/uL (ref 0.7–4.0)
MCH: 31.7 pg (ref 26.0–34.0)
MCHC: 34.7 g/dL (ref 30.0–36.0)
MCV: 91.4 fL (ref 80.0–100.0)
Monocytes Absolute: 0.5 10*3/uL (ref 0.1–1.0)
Monocytes Relative: 7 %
Neutro Abs: 5 10*3/uL (ref 1.7–7.7)
Neutrophils Relative %: 68 %
Platelets: 277 10*3/uL (ref 150–400)
RBC: 4.29 MIL/uL (ref 4.22–5.81)
RDW: 12.3 % (ref 11.5–15.5)
WBC: 7.5 10*3/uL (ref 4.0–10.5)
nRBC: 0 % (ref 0.0–0.2)

## 2022-04-14 LAB — RAPID URINE DRUG SCREEN, HOSP PERFORMED
Amphetamines: NOT DETECTED
Barbiturates: NOT DETECTED
Benzodiazepines: NOT DETECTED
Cocaine: NOT DETECTED
Opiates: NOT DETECTED
Tetrahydrocannabinol: NOT DETECTED

## 2022-04-14 LAB — COMPREHENSIVE METABOLIC PANEL
ALT: 43 U/L (ref 0–44)
AST: 31 U/L (ref 15–41)
Albumin: 3.9 g/dL (ref 3.5–5.0)
Alkaline Phosphatase: 59 U/L (ref 38–126)
Anion gap: 12 (ref 5–15)
BUN: 46 mg/dL — ABNORMAL HIGH (ref 6–20)
CO2: 22 mmol/L (ref 22–32)
Calcium: 9.6 mg/dL (ref 8.9–10.3)
Chloride: 104 mmol/L (ref 98–111)
Creatinine, Ser: 2.27 mg/dL — ABNORMAL HIGH (ref 0.61–1.24)
GFR, Estimated: 32 mL/min — ABNORMAL LOW (ref >60)
Glucose, Bld: 205 mg/dL — ABNORMAL HIGH (ref 70–99)
Potassium: 3.5 mmol/L (ref 3.5–5.1)
Sodium: 138 mmol/L (ref 135–145)
Total Bilirubin: 0.5 mg/dL (ref 0.3–1.2)
Total Protein: 7.4 g/dL (ref 6.5–8.1)

## 2022-04-14 LAB — ETHANOL: Alcohol, Ethyl (B): <10 mg/dL (ref <10)

## 2022-04-14 LAB — AMMONIA: Ammonia: 25 umol/L (ref 9–35)

## 2022-04-14 MED ORDER — LACTATED RINGERS IV BOLUS
1000.0000 mL | Freq: Once | INTRAVENOUS | Status: AC
  Administered 2022-04-14: 1000 mL via INTRAVENOUS

## 2022-04-14 NOTE — ED Provider Notes (Signed)
[facility] EMERGENCY DEPARTMENT Provider Note   CSN: [id number] Arrival date & time: 04/14/22  0253     History  Chief Complaint  Patient presents with   Altered Mental Status    Nathan Sanchez is a 61 y.o. male.  Patient presents to the emergency department for evaluation of confusion.  Patient reportedly has not been sleeping well recently.  He fell asleep tonight and slept for 5 or 6 hours and then woke up startled.  He got up and tried to walk, fell to the ground.  Since then he has been feeling disoriented.       Home Medications Prior to Admission medications   Medication Sig Start Date End Date Taking? Authorizing Provider  baclofen (LIORESAL) 20 MG tablet Take 20 mg by mouth 3 (three) times daily as needed. 09/29/21   [provider]  folic acid (FOLVITE) 1 MG tablet Take 1 tablet (1 mg total) by mouth daily. 10/13/21   Barton Dubois, MD  glimepiride (AMARYL) 4 MG tablet Take 4 mg by mouth daily. 09/27/21   [provider]  hydrocortisone 2.5 % cream Apply topically 2 (two) times daily.    [provider]  JARDIANCE 25 MG TABS tablet Take 25 mg by mouth daily. 09/27/21   [provider]  ondansetron (ZOFRAN-ODT) 8 MG disintegrating tablet Take 1 tablet (8 mg total) by mouth every 8 (eight) hours as needed for nausea or vomiting. 10/12/21   Barton Dubois, MD  Oxycodone HCl 10 MG TABS Take 10 mg by mouth 4 (four) times daily as needed. 09/29/21   [provider]  pregabalin (LYRICA) 75 MG capsule Take 75 mg by mouth 2 (two) times daily. 09/12/21   [provider]  rOPINIRole (REQUIP) 1 MG tablet Take 1 mg by mouth at bedtime. 09/20/21   [provider]  thiamine 100 MG tablet Take 1 tablet (100 mg total) by mouth daily. 10/13/21   Barton Dubois, MD  Vitamin D, Ergocalciferol, (DRISDOL) 1.25 MG (50000 UNIT) CAPS capsule Take 50,000 Units by mouth once a week. 09/29/21   [provider]      Allergies     Patient has no known allergies.    Review of Systems   Review of Systems  Physical Exam Updated Vital Signs BP 100/70   Pulse (!) 59   Temp 97.8 F (36.6 C) (Oral)   Resp (!) 22   Ht 6\' 2"  (1.88 m)   Wt 104.3 kg   SpO2 96%   BMI 29.53 kg/m  Physical Exam Vitals and nursing note reviewed.  Constitutional:      General: He is not in acute distress.    Appearance: He is well-developed.  HENT:     Head: Normocephalic and atraumatic.     Mouth/Throat:     Mouth: Mucous membranes are moist.  Eyes:     General: Vision grossly intact. Gaze aligned appropriately.     Extraocular Movements: Extraocular movements intact.     Conjunctiva/sclera: Conjunctivae normal.  Cardiovascular:     Rate and Rhythm: Normal rate and regular rhythm.     Pulses: Normal pulses.     Heart sounds: Normal heart sounds, S1 normal and S2 normal. No murmur heard.    No friction rub. No gallop.  Pulmonary:     Effort: Pulmonary effort is normal. No respiratory distress.     Breath sounds: Normal breath sounds.  Abdominal:     Palpations: Abdomen is soft.  Tenderness: There is no abdominal tenderness. There is no guarding or rebound.     Hernia: No hernia is present.  Musculoskeletal:        General: No swelling.     Cervical back: Full passive range of motion without pain, normal range of motion and neck supple. No pain with movement, spinous process tenderness or muscular tenderness. Normal range of motion.     Right lower leg: No edema.     Left lower leg: No edema.  Skin:    General: Skin is warm and dry.     Capillary Refill: Capillary refill takes less than 2 seconds.     Findings: No ecchymosis, erythema, lesion or wound.  Neurological:     Mental Status: He is alert and oriented to person, place, and time.     GCS: GCS eye subscore is 4. GCS verbal subscore is 5. GCS motor subscore is 6.     Cranial Nerves: Cranial nerves 2-12 are intact.     Sensory: Sensation is intact.     Motor:  Motor function is intact. No weakness or abnormal muscle tone.     Coordination: Coordination is intact.  Psychiatric:        Mood and Affect: Mood normal.        Speech: Speech normal.        Behavior: Behavior normal.     ED Results / Procedures / Treatments   Labs (all labs ordered are listed, but only abnormal results are displayed) Labs Reviewed  COMPREHENSIVE METABOLIC PANEL - Abnormal; Notable for the following components:      Result Value   Glucose, Bld 205 (*)    BUN 46 (*)    Creatinine, Ser 2.27 (*)    GFR, Estimated 32 (*)    All other components within normal limits  URINALYSIS, ROUTINE W REFLEX MICROSCOPIC - Abnormal; Notable for the following components:   Glucose, UA 150 (*)    All other components within normal limits  CBC WITH DIFFERENTIAL/PLATELET  AMMONIA  ETHANOL  RAPID URINE DRUG SCREEN, HOSP PERFORMED    EKG EKG Interpretation  Date/Time:  Friday April 14 2022 03:42:21 EDT Ventricular Rate:  73 PR Interval:  175 QRS Duration: 101 QT Interval:  407 QTC Calculation: 449 R Axis:   46 Text Interpretation: Sinus rhythm Low voltage, precordial leads Abnormal R-wave progression, late transition Baseline wander in lead(s) V5 Confirmed by Gilda Crease [phone]) on 04/14/2022 3:53:20 AM  Radiology CT HEAD WO CONTRAST ( )  Result Date: 04/14/2022 CLINICAL DATA:  61 year old male status post fall at home this morning. EXAM: CT HEAD WITHOUT CONTRAST TECHNIQUE: Contiguous axial images were obtained from the base of the skull through the vertex without intravenous contrast. RADIATION DOSE REDUCTION: This exam was performed according to the departmental dose-optimization program which includes automated exposure control, adjustment of the mA and/or kV according to patient size and/or use of iterative reconstruction technique. COMPARISON:  Head CT 02/13/2007. FINDINGS: Brain: Cerebral volume loss since 2008 appears to be generalized. Cerebral volume is  within normal limits for age. No midline shift, ventriculomegaly, mass effect, evidence of mass lesion, intracranial hemorrhage or evidence of cortically based acute infarction. Mild for age patchy bilateral white matter hypodensity has developed since 2008 (eg. anterior right corona radiata series 2, image 18). Also, a small chronic appearing linear infarct in the posterior right cerebellum is new (series 2, image 8). Elsewhere gray-white matter differentiation is within normal limits. Vascular: Faint Calcified atherosclerosis at the  skull base. No suspicious intracranial vascular hyperdensity. Skull: Stable, intact. Sinuses/Orbits: Visualized paranasal sinuses and mastoids are stable and well aerated. Other: No orbit or scalp soft tissue injury identified. IMPRESSION: 1. No acute traumatic injury identified. 2. 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 Electronically Signed   By: Odessa Fleming M.D.   On: 04/14/2022 04:09    Procedures Procedures    Medications Ordered in ED Medications  lactated ringers bolus 1,000 mL (has no administration in time range)  lactated ringers bolus 1,000 mL (1,000 mLs Intravenous New Bag/Given 04/14/22 3825)    ED Course/ Medical Decision Making/ A&P                           Medical Decision Making Amount and/or Complexity of Data Reviewed Labs: ordered. Radiology: ordered.   Presents to the emergency department for evaluation of mental status changes.  Differential diagnosis is extremely broad, includes stroke, head injury, metabolic encephalopathies.  Patient has a nonfocal neurologic exam.  He seems somewhat slow to answer questions, but is oriented and does appropriately answer questions after a brief pause.  Patient underwent CT scan to further evaluate for his confusion as well as the fall.  No intracranial injury or other pathology noted.  Patient  underwent laboratory evaluation as well.  His only abnormality is evidence of acute kidney injury.  Significant other reports that he "sweats a lot" and he was also recently placed on lisinopril-HCTZ by his primary care doctor.  She did give him an extra dose prior to coming to the ED because she thought his blood pressure was high.  Patient will be administered 2 L of lactated Ringer's.  Recheck BUN/creatinine at that time.  We will sign out to oncoming ER physician to follow-up repeat labs and reexamine the patient.  If he is appropriate for discharge, we recommend stopping lisinopril-HCTZ, follow-up with primary care for repeat blood pressure check.        Final Clinical Impression(s) / ED Diagnoses Final diagnoses:  AKI (acute kidney injury) [facility])    Rx / DC Orders ED Discharge Orders     None         Laynee Lockamy, Canary Brim, MD 04/14/22 [phone]

## 2022-04-14 NOTE — ED Triage Notes (Signed)
Pt brought in by wife from home. Wife states pt got up this morning to go to bathroom and fell. Pt states that everything had went black before he fell. Pt unable to sit still during triage. Pt wife states she checked his BP this morning and it was high so she gave him a lisinopril and Buspar.

## 2022-04-14 NOTE — Discharge Instructions (Addendum)
Stop taking the lisinopril-hydrochlorothiazide, as both of these can cause kidney problems.  Have your blood pressure rechecked by your doctor in the next week or so to determine if you need a different medication for your blood pressure.

## 2022-05-25 ENCOUNTER — Telehealth: Payer: Self-pay | Admitting: Podiatry

## 2022-05-25 NOTE — Telephone Encounter (Addendum)
DOS: 06/26/2022  [employer] Medicare Effective 06/19/2022  Hallux MPJ Fusion Rt ([id number])  Deductible: $0 Out-of-Pocket: $3,600 with $0 met CoInsurance: 0% Copay: $295  Authorization #: Pending Tracking #: [id number] Authorization Valid:

## 2022-06-21 ENCOUNTER — Encounter (HOSPITAL_BASED_OUTPATIENT_CLINIC_OR_DEPARTMENT_OTHER): Payer: Self-pay | Admitting: Podiatry

## 2022-06-21 NOTE — Progress Notes (Signed)
Spoke w/ via phone for pre-op interview--- Pt wife Katharine Look Lab needs dos----  I-stat; Recent EKG in [facility] results------ COVID test -----patient states asymptomatic no test needed Arrive at ------- 0530 NPO after MN NO Solid Food.  Clear liquids from MN until--- 0430 Med rec completed Medications to take morning of surgery -----  Diabetic medication ----- Patient instructed not to take Amaryl DOS Patient instructed no nail polish to be worn day of surgery Patient instructed to bring photo id and insurance card day of surgery Patient aware to have Driver (ride ) / caregiver    for 24 hours after surgery Kathi Der Patient Special Instructions ----- Do not take Lisinopril DOS.  Pre-Op special Istructions ----- Patient verbalized understanding of instructions that were given at this phone interview. Patient denies shortness of breath, chest pain, fever, cough at this phone interview.

## 2022-06-23 NOTE — Anesthesia Preprocedure Evaluation (Signed)
Anesthesia Evaluation  Patient identified by MRN, date of birth, ID band Patient awake    Reviewed: Allergy & Precautions, NPO status , Patient's Chart, lab work & pertinent test results  History of Anesthesia Complications Negative for: history of anesthetic complications  Airway Mallampati: II  TM Distance: >3 FB Neck ROM: Full   Comment: Previous grade I view with video laryngoscopy Dental  (+) Poor Dentition, Dental Advisory Given,    Pulmonary neg pulmonary ROS   Pulmonary exam normal breath sounds clear to auscultation       Cardiovascular hypertension (lisinopril-HCTZ), Pt. on medications (-) angina (-) Past MI, (-) Cardiac Stents, (-) CABG and (-) Orthopnea  Rhythm:Regular Rate:Normal     Neuro/Psych  PSYCHIATRIC DISORDERS Anxiety Depression    negative neurological ROS     GI/Hepatic Neg liver ROS,GERD  ,,  Endo/Other  diabetes, Type 2, Oral Hypoglycemic Agents    Renal/GU negative Renal ROS     Musculoskeletal   Abdominal   Peds  Hematology  (+) Blood dyscrasia, anemia   Anesthesia Other Findings   Reproductive/Obstetrics                             Anesthesia Physical Anesthesia Plan  ASA: 2  Anesthesia Plan: Regional and MAC   Post-op Pain Management:    Induction: Intravenous  PONV Risk Score and Plan: 1  Airway Management Planned: Natural Airway and Simple Face Mask  Additional Equipment:   Intra-op Plan:   Post-operative Plan:   Informed Consent: I have reviewed the patients History and Physical, chart, labs and discussed the procedure including the risks, benefits and alternatives for the proposed anesthesia with the patient or authorized representative who has indicated his/her understanding and acceptance.     Dental advisory given  Plan Discussed with: CRNA and Anesthesiologist  Anesthesia Plan Comments: (Discussed potential risks of nerve blocks  including, but not limited to, infection, bleeding, nerve damage, seizures, pneumothorax, respiratory depression, and potential failure of the block. Alternatives to nerve blocks discussed. All questions answered.  Discussed with patient risks of MAC including, but not limited to, minor pain or discomfort, hearing people in the room, and possible need for backup general anesthesia. Risks for general anesthesia also discussed including, but not limited to, sore throat, hoarse voice, chipped/damaged teeth, injury to vocal cords, nausea and vomiting, allergic reactions, lung infection, heart attack, stroke, and death. All questions answered.  )        Anesthesia Quick Evaluation

## 2022-06-26 ENCOUNTER — Ambulatory Visit (HOSPITAL_BASED_OUTPATIENT_CLINIC_OR_DEPARTMENT_OTHER): Payer: Medicare HMO | Admitting: Anesthesiology

## 2022-06-26 ENCOUNTER — Encounter (HOSPITAL_BASED_OUTPATIENT_CLINIC_OR_DEPARTMENT_OTHER)
Admission: RE | Disposition: A | Discharge status: Home / Self Care | Payer: Self-pay | Source: Home / Self Care | Attending: Podiatry

## 2022-06-26 ENCOUNTER — Encounter (HOSPITAL_BASED_OUTPATIENT_CLINIC_OR_DEPARTMENT_OTHER): Payer: Self-pay | Admitting: Podiatry

## 2022-06-26 ENCOUNTER — Other Ambulatory Visit: Payer: Self-pay

## 2022-06-26 ENCOUNTER — Other Ambulatory Visit: Payer: Self-pay | Admitting: Podiatry

## 2022-06-26 ENCOUNTER — Encounter: Payer: Self-pay | Admitting: Podiatry

## 2022-06-26 ENCOUNTER — Ambulatory Visit (HOSPITAL_BASED_OUTPATIENT_CLINIC_OR_DEPARTMENT_OTHER): Payer: Medicare HMO

## 2022-06-26 ENCOUNTER — Ambulatory Visit (HOSPITAL_COMMUNITY): Payer: Medicare HMO

## 2022-06-26 ENCOUNTER — Ambulatory Visit (HOSPITAL_BASED_OUTPATIENT_CLINIC_OR_DEPARTMENT_OTHER)
Admission: RE | Admit: 2022-06-26 | Discharge: 2022-06-26 | Disposition: A | Discharge status: Home / Self Care | Payer: Medicare HMO | Attending: Podiatry | Admitting: Podiatry

## 2022-06-26 DIAGNOSIS — K219 Gastro-esophageal reflux disease without esophagitis: Secondary | ICD-10-CM | POA: Diagnosis not present

## 2022-06-26 DIAGNOSIS — M2021 Hallux rigidus, right foot: Secondary | ICD-10-CM | POA: Diagnosis not present

## 2022-06-26 DIAGNOSIS — Z79899 Other long term (current) drug therapy: Secondary | ICD-10-CM | POA: Insufficient documentation

## 2022-06-26 DIAGNOSIS — Z7985 Long-term (current) use of injectable non-insulin antidiabetic drugs: Secondary | ICD-10-CM | POA: Diagnosis not present

## 2022-06-26 DIAGNOSIS — I10 Essential (primary) hypertension: Secondary | ICD-10-CM | POA: Diagnosis not present

## 2022-06-26 DIAGNOSIS — E119 Type 2 diabetes mellitus without complications: Secondary | ICD-10-CM | POA: Diagnosis not present

## 2022-06-26 DIAGNOSIS — M47816 Spondylosis without myelopathy or radiculopathy, lumbar region: Secondary | ICD-10-CM | POA: Insufficient documentation

## 2022-06-26 DIAGNOSIS — M19071 Primary osteoarthritis, right ankle and foot: Secondary | ICD-10-CM | POA: Diagnosis not present

## 2022-06-26 DIAGNOSIS — Z7984 Long term (current) use of oral hypoglycemic drugs: Secondary | ICD-10-CM

## 2022-06-26 DIAGNOSIS — Z01818 Encounter for other preprocedural examination: Secondary | ICD-10-CM

## 2022-06-26 DIAGNOSIS — F419 Anxiety disorder, unspecified: Secondary | ICD-10-CM | POA: Diagnosis not present

## 2022-06-26 DIAGNOSIS — E559 Vitamin D deficiency, unspecified: Secondary | ICD-10-CM | POA: Diagnosis not present

## 2022-06-26 HISTORY — DX: Essential (primary) hypertension: I10

## 2022-06-26 HISTORY — PX: ARTHRODESIS METATARSALPHALANGEAL JOINT (MTPJ): SHX6566

## 2022-06-26 LAB — POCT I-STAT, CHEM 8
BUN: 23 mg/dL (ref 8–23)
Calcium, Ion: 1.26 mmol/L (ref 1.15–1.40)
Chloride: 99 mmol/L (ref 98–111)
Creatinine, Ser: 1.2 mg/dL (ref 0.61–1.24)
Glucose, Bld: 169 mg/dL — ABNORMAL HIGH (ref 70–99)
HCT: 43 % (ref 39.0–52.0)
Hemoglobin: 14.6 g/dL (ref 13.0–17.0)
Potassium: 4.1 mmol/L (ref 3.5–5.1)
Sodium: 139 mmol/L (ref 135–145)
TCO2: 30 mmol/L (ref 22–32)

## 2022-06-26 LAB — GLUCOSE, CAPILLARY: Glucose-Capillary: 178 mg/dL — ABNORMAL HIGH (ref 70–99)

## 2022-06-26 SURGERY — FUSION, JOINT, GREAT TOE
Anesthesia: Monitor Anesthesia Care | Site: Toe | Laterality: Right

## 2022-06-26 MED ORDER — CEFAZOLIN SODIUM-DEXTROSE 2-4 GM/100ML-% IV SOLN
INTRAVENOUS | Status: AC
  Filled 2022-06-26: qty 100

## 2022-06-26 MED ORDER — PROMETHAZINE HCL 25 MG/ML IJ SOLN: 6.2500 mg | INTRAMUSCULAR | Status: DC | PRN

## 2022-06-26 MED ORDER — GLYCOPYRROLATE 0.2 MG/ML IJ SOLN
INTRAMUSCULAR | Status: DC | PRN
  Administered 2022-06-26: .2 mg via INTRAVENOUS

## 2022-06-26 MED ORDER — ACETAMINOPHEN 500 MG PO TABS
ORAL_TABLET | ORAL | Status: AC
  Filled 2022-06-26: qty 2

## 2022-06-26 MED ORDER — FENTANYL CITRATE (PF) 100 MCG/2ML IJ SOLN: 25.0000 ug | INTRAMUSCULAR | Status: DC | PRN

## 2022-06-26 MED ORDER — 0.9 % SODIUM CHLORIDE (POUR BTL) OPTIME
TOPICAL | Status: DC | PRN
  Administered 2022-06-26: 500 mL

## 2022-06-26 MED ORDER — PROPOFOL 500 MG/50ML IV EMUL
INTRAVENOUS | Status: AC
  Filled 2022-06-26: qty 50

## 2022-06-26 MED ORDER — LACTATED RINGERS IV SOLN: INTRAVENOUS | Status: DC

## 2022-06-26 MED ORDER — OXYCODONE HCL 5 MG PO TABS: 5.0000 mg | ORAL_TABLET | Freq: Once | ORAL | Status: DC | PRN

## 2022-06-26 MED ORDER — EPHEDRINE 5 MG/ML INJ
INTRAVENOUS | Status: AC
  Filled 2022-06-26: qty 5

## 2022-06-26 MED ORDER — FENTANYL CITRATE (PF) 100 MCG/2ML IJ SOLN
50.0000 ug | Freq: Once | INTRAMUSCULAR | Status: AC
  Administered 2022-06-26: 50 ug via INTRAVENOUS

## 2022-06-26 MED ORDER — PROPOFOL 500 MG/50ML IV EMUL
INTRAVENOUS | Status: DC | PRN
  Administered 2022-06-26: 100 ug/kg/min via INTRAVENOUS

## 2022-06-26 MED ORDER — ACETAMINOPHEN 500 MG PO TABS
1000.0000 mg | ORAL_TABLET | Freq: Once | ORAL | Status: AC
  Administered 2022-06-26: 1000 mg via ORAL

## 2022-06-26 MED ORDER — IBUPROFEN 800 MG PO TABS: 800.0000 mg | ORAL_TABLET | Freq: Four times a day (QID) | ORAL | 1 refills | Status: DC | PRN

## 2022-06-26 MED ORDER — ARTIFICIAL TEARS OPHTHALMIC OINT
TOPICAL_OINTMENT | OPHTHALMIC | Status: AC
  Filled 2022-06-26: qty 3.5

## 2022-06-26 MED ORDER — DEXMEDETOMIDINE HCL IN NACL 80 MCG/20ML IV SOLN
INTRAVENOUS | Status: AC
  Filled 2022-06-26: qty 20

## 2022-06-26 MED ORDER — MIDAZOLAM HCL 2 MG/2ML IJ SOLN
INTRAMUSCULAR | Status: AC
  Filled 2022-06-26: qty 2

## 2022-06-26 MED ORDER — DEXAMETHASONE SODIUM PHOSPHATE 10 MG/ML IJ SOLN
INTRAMUSCULAR | Status: AC
  Filled 2022-06-26: qty 1

## 2022-06-26 MED ORDER — GLYCOPYRROLATE PF 0.2 MG/ML IJ SOSY
PREFILLED_SYRINGE | INTRAMUSCULAR | Status: AC
  Filled 2022-06-26: qty 1

## 2022-06-26 MED ORDER — MIDAZOLAM HCL 2 MG/2ML IJ SOLN
1.0000 mg | Freq: Once | INTRAMUSCULAR | Status: AC
  Administered 2022-06-26: 1 mg via INTRAVENOUS

## 2022-06-26 MED ORDER — FENTANYL CITRATE (PF) 100 MCG/2ML IJ SOLN
INTRAMUSCULAR | Status: AC
  Filled 2022-06-26: qty 2

## 2022-06-26 MED ORDER — ONDANSETRON HCL 4 MG/2ML IJ SOLN
INTRAMUSCULAR | Status: AC
  Filled 2022-06-26: qty 2

## 2022-06-26 MED ORDER — LIDOCAINE HCL (CARDIAC) PF 100 MG/5ML IV SOSY
PREFILLED_SYRINGE | INTRAVENOUS | Status: DC | PRN
  Administered 2022-06-26: 50 mg via INTRAVENOUS

## 2022-06-26 MED ORDER — ONDANSETRON HCL 4 MG/2ML IJ SOLN
INTRAMUSCULAR | Status: DC | PRN
  Administered 2022-06-26: 4 mg via INTRAVENOUS

## 2022-06-26 MED ORDER — PROPOFOL 10 MG/ML IV BOLUS
INTRAVENOUS | Status: DC | PRN
  Administered 2022-06-26 (×2): 30 mg via INTRAVENOUS

## 2022-06-26 MED ORDER — OXYCODONE-ACETAMINOPHEN 5-325 MG PO TABS: 1.0000 | ORAL_TABLET | ORAL | 0 refills | Status: DC | PRN

## 2022-06-26 MED ORDER — OXYCODONE HCL 5 MG/5ML PO SOLN: 5.0000 mg | Freq: Once | ORAL | Status: DC | PRN

## 2022-06-26 MED ORDER — EPHEDRINE SULFATE (PRESSORS) 50 MG/ML IJ SOLN
INTRAMUSCULAR | Status: DC | PRN
  Administered 2022-06-26: 15 mg via INTRAVENOUS
  Administered 2022-06-26: 10 mg via INTRAVENOUS

## 2022-06-26 MED ORDER — LIDOCAINE HCL (PF) 2 % IJ SOLN
INTRAMUSCULAR | Status: AC
  Filled 2022-06-26: qty 5

## 2022-06-26 MED ORDER — CEFAZOLIN SODIUM-DEXTROSE 2-4 GM/100ML-% IV SOLN
2.0000 g | Freq: Once | INTRAVENOUS | Status: AC
  Administered 2022-06-26: 2 g via INTRAVENOUS

## 2022-06-26 MED ORDER — DEXAMETHASONE SODIUM PHOSPHATE 4 MG/ML IJ SOLN
INTRAMUSCULAR | Status: DC | PRN
  Administered 2022-06-26: 4 mg via INTRAVENOUS

## 2022-06-26 MED ORDER — DEXMEDETOMIDINE HCL IN NACL 400 MCG/100ML IV SOLN
INTRAVENOUS | Status: DC | PRN
  Administered 2022-06-26: 8 ug via INTRAVENOUS

## 2022-06-26 MED ORDER — FENTANYL CITRATE (PF) 100 MCG/2ML IJ SOLN
INTRAMUSCULAR | Status: DC | PRN
  Administered 2022-06-26: 25 ug via INTRAVENOUS

## 2022-06-26 MED ORDER — MIDAZOLAM HCL 5 MG/5ML IJ SOLN
INTRAMUSCULAR | Status: DC | PRN
  Administered 2022-06-26: 1 mg via INTRAVENOUS

## 2022-06-26 MED ORDER — PROPOFOL 1000 MG/100ML IV EMUL
INTRAVENOUS | Status: AC
  Filled 2022-06-26: qty 100

## 2022-06-26 MED ORDER — PHENYLEPHRINE 80 MCG/ML (10ML) SYRINGE FOR IV PUSH (FOR BLOOD PRESSURE SUPPORT)
PREFILLED_SYRINGE | INTRAVENOUS | Status: DC | PRN
  Administered 2022-06-26 (×3): 80 ug via INTRAVENOUS

## 2022-06-26 MED ORDER — PHENYLEPHRINE 80 MCG/ML (10ML) SYRINGE FOR IV PUSH (FOR BLOOD PRESSURE SUPPORT)
PREFILLED_SYRINGE | INTRAVENOUS | Status: AC
  Filled 2022-06-26: qty 10

## 2022-06-26 MED ORDER — ROPIVACAINE HCL 5 MG/ML IJ SOLN
INTRAMUSCULAR | Status: DC | PRN
  Administered 2022-06-26: 15 mL via PERINEURAL
  Administered 2022-06-26: 30 mL via PERINEURAL

## 2022-06-26 SURGICAL SUPPLY — 68 items
APL PRP STRL LF DISP 70% ISPRP (MISCELLANEOUS) ×1
BLADE AVERAGE 25X9 (BLADE) IMPLANT
BLADE OSC/SAG .038X5.5 CUT EDG (BLADE) IMPLANT
BLADE OSCILLATING SAW SHORT (MISCELLANEOUS) IMPLANT
BLADE OSCILLATING/SAGITTAL (BLADE) ×1
BLADE SURG 15 STRL LF DISP TIS (BLADE) ×2 IMPLANT
BLADE SURG 15 STRL SS (BLADE) ×4
BLADE SW THK.38XMED LNG THN (BLADE) ×1 IMPLANT
BNDG CMPR 9X4 STRL LF SNTH (GAUZE/BANDAGES/DRESSINGS) ×1
BNDG ELASTIC 4X5.8 VLCR STR LF (GAUZE/BANDAGES/DRESSINGS) ×2 IMPLANT
BNDG ESMARK 4X9 LF (GAUZE/BANDAGES/DRESSINGS) ×1 IMPLANT
BNDG GAUZE DERMACEA FLUFF 4 (GAUZE/BANDAGES/DRESSINGS) ×1 IMPLANT
BNDG GZE DERMACEA 4 6PLY (GAUZE/BANDAGES/DRESSINGS) ×1
BONE STAPLE 18X18X18 (Staple) ×1 IMPLANT
BUR OVAL CARBIDE 4.0 (BURR) IMPLANT
CHLORAPREP W/TINT 26 (MISCELLANEOUS) ×1 IMPLANT
CLOTH BEACON ORANGE TIMEOUT ST (SAFETY) ×1 IMPLANT
COVER BACK TABLE 60X90IN (DRAPES) ×1 IMPLANT
CUFF TOURN SGL QUICK 12 (TOURNIQUET CUFF) IMPLANT
CUFF TOURN SGL QUICK 18X4 (TOURNIQUET CUFF) IMPLANT
DRAPE EXTREMITY T 121X128X90 (DISPOSABLE) ×1 IMPLANT
DRAPE IMP U-DRAPE 54X76 (DRAPES) ×1 IMPLANT
DRAPE OEC MINIVIEW 54X84 (DRAPES) ×1 IMPLANT
DRAPE U-SHAPE 47X51 STRL (DRAPES) ×1 IMPLANT
ELECT REM PT RETURN 9FT ADLT (ELECTROSURGICAL) ×1
ELECTRODE REM PT RTRN 9FT ADLT (ELECTROSURGICAL) ×1 IMPLANT
GAUZE SPONGE 4X4 12PLY STRL (GAUZE/BANDAGES/DRESSINGS) ×1 IMPLANT
GAUZE XEROFORM 1X8 LF (GAUZE/BANDAGES/DRESSINGS) ×1 IMPLANT
GLOVE BIO SURGEON STRL SZ7 (GLOVE) ×1 IMPLANT
GLOVE BIOGEL PI IND STRL 7.5 (GLOVE) ×1 IMPLANT
GOWN STRL REUS W/ TWL XL LVL3 (GOWN DISPOSABLE) ×2 IMPLANT
GOWN STRL REUS W/TWL XL LVL3 (GOWN DISPOSABLE) ×2
K-WIRE .062 (WIRE)
K-WIRE DBL END TROCAR 6X.062 (WIRE) ×1
K-WIRE FX6X.062X2 END TROC (WIRE)
KIT INSTRUMENT DYNAFORCE PLATE (KITS) IMPLANT
KIT TURNOVER CYSTO (KITS) ×1 IMPLANT
KWIRE DBL END TROCAR 6X.062 (WIRE) IMPLANT
KWIRE FX6X.062X2 END TROC (WIRE) IMPLANT
MANIFOLD NEPTUNE II (INSTRUMENTS) ×1 IMPLANT
NDL FILTER BLUNT 18X1 1/2 (NEEDLE) ×1 IMPLANT
NDL HYPO 25X1 1.5 SAFETY (NEEDLE) ×3 IMPLANT
NEEDLE FILTER BLUNT 18X1 1/2 (NEEDLE) ×1 IMPLANT
NEEDLE HYPO 25X1 1.5 SAFETY (NEEDLE) ×3 IMPLANT
NS IRRIG 500ML POUR BTL (IV SOLUTION) ×1 IMPLANT
PACK BASIN DAY SURGERY FS (CUSTOM PROCEDURE TRAY) ×1 IMPLANT
PAD CAST 4YDX4 CTTN HI CHSV (CAST SUPPLIES) ×1 IMPLANT
PAD PREP 24X48 CUFFED NSTRL (MISCELLANEOUS) ×1 IMPLANT
PADDING CAST COTTON 4X4 STRL (CAST SUPPLIES) ×1
PENCIL SMOKE EVACUATOR (MISCELLANEOUS) ×1 IMPLANT
PLATE MTP STANDARD 18 (Plate) ×1 IMPLANT
PLATE MTP STD 18 (Plate) IMPLANT
RASP SM TEAR CROSS CUT (RASP) IMPLANT
SCREW LOCKING POLYAXIAL 3.5X16 (Screw) IMPLANT
SCREW PA MOTO BAND 3.5X14 (Screw) IMPLANT
STAPLE BONE 18X18X18 COMPR (Staple) IMPLANT
STAPLE PREP KIT 18 (KITS) IMPLANT
STOCKINETTE 4X48 STRL (DRAPES) ×1 IMPLANT
SUCTION FRAZIER HANDLE 10FR (MISCELLANEOUS) ×1
SUCTION TUBE FRAZIER 10FR DISP (MISCELLANEOUS) ×1 IMPLANT
SUT MNCRL AB 3-0 PS2 27 (SUTURE) ×1 IMPLANT
SUT MNCRL AB 4-0 PS2 18 (SUTURE) ×1 IMPLANT
SUT MON AB 5-0 PS2 18 (SUTURE) ×1 IMPLANT
SUT PROLENE 4 0 PS 2 18 (SUTURE) IMPLANT
SYR 10ML LL (SYRINGE) ×2 IMPLANT
SYR BULB EAR ULCER 3OZ GRN STR (SYRINGE) ×1 IMPLANT
TUBE CONNECTING 12X1/4 (SUCTIONS) ×1 IMPLANT
WATER STERILE IRR 500ML POUR (IV SOLUTION) ×1 IMPLANT

## 2022-06-26 NOTE — Discharge Instructions (Addendum)
After Surgery Instructions   1) If you are recuperating from surgery anywhere other than home, please be sure to leave [place] the number where you can be reached.  2) Go directly home and rest.  3) Keep the operated foot(feet) elevated six inches above the hip when sitting or lying down. This will help control swelling and pain.  4) Support the elevated foot and leg with pillows. DO NOT PLACE PILLOWS UNDER THE KNEE.  5) DO NOT REMOVE or get your bandages WET, unless you were given different instructions by your doctor to do so. This increases the risk of infection.  6) Wear your surgical shoe or surgical boot at all times when you are up on your feet.  7) A limited amount of pain and swelling may occur. The skin may take on a bruised appearance. DO NOT BE ALARMED, THIS IS NORMAL.  8) For slight pain and swelling, apply an ice pack directly over the bandages for 15 minutes only out of each hour of the day. Continue until seen in the office for your first post op visit. DON NOT     APPLY ANY FORM OF HEAT TO THE AREA.  9) Have prescriptions filled immediately and take as directed.  10) Drink lots of liquids, water and juice to stay hydrated.  11) CALL IMMEDIATELY IF:  *Bleeding continues until the following day of surgery  *Pain increases and/or does not respond to medication  *Bandages or cast appears to tight  *If your bandage gets wet  *Trip, fall or stump your surgical foot  *If your temperature goes above 101  *If you have ANY questions at all  [place]. ADHERING TO THESE INSTRUCTIONS WILL OFFER YOU THE MOST COMPLETE RESULTS        No acetaminophen/Tylenol until after 12:00 pm today if needed.   Post Anesthesia Home Care Instructions  Activity: Get plenty of rest for the remainder of the day. A responsible individual must stay with you for 24 hours following the procedure.  For the next 24 hours, DO NOT: -Drive a car -Conservation officer, nature -Drink alcoholic beverages -Take any medication unless instructed by your physician -Make any legal decisions or sign important papers.  Meals: Start with liquid foods such as gelatin or soup. Progress to regular foods as tolerated. Avoid greasy, spicy, heavy foods. If nausea and/or vomiting occur, drink only clear liquids until the nausea and/or vomiting subsides. Call your physician if vomiting continues.  Special Instructions/Symptoms: Your throat may feel dry or sore from the anesthesia or the breathing tube placed in your throat during surgery. If this causes discomfort, gargle with warm salt water. The discomfort should disappear within 24 hours.

## 2022-06-26 NOTE — Anesthesia Procedure Notes (Signed)
Anesthesia Regional Block: Adductor canal block   Pre-Anesthetic Checklist: , timeout performed,  Correct Patient, Correct Site, Correct Laterality,  Correct Procedure, Correct Position, site marked,  Risks and benefits discussed,  Surgical consent,  Pre-op evaluation,  At surgeon's request and post-op pain management  Laterality: Right  Prep: chloraprep       Needles:  Injection technique: Single-shot  Needle Type: Echogenic Stimulator Needle     Needle Length: 9cm  Needle Gauge: 21     Additional Needles:   Procedures:,,,, ultrasound used (permanent image in chart),,    Narrative:  Start time: 06/26/2022 7:12 AM End time: 06/26/2022 7:16 AM Injection made incrementally with aspirations every 5 mL.  Performed by: Personally  Anesthesiologist: Nilda Simmer, MD  Additional Notes: Discussed risks and benefits of nerve block including, but not limited to, prolonged and/or permanent nerve injury involving sensory and/or motor function. Monitors were applied and a time-out was performed. The nerve and associated structures were visualized under ultrasound guidance. After negative aspiration, local anesthetic was slowly injected around the nerve. There was no evidence of high pressure during the procedure. There were no paresthesias. VSS remained stable and the patient tolerated the procedure well.

## 2022-06-26 NOTE — Anesthesia Postprocedure Evaluation (Signed)
Anesthesia Post Note  Patient: Nathan Sanchez  Procedure(s) Performed: ARTHRODESIS METATARSALPHALANGEAL JOINT (MTPJ) (Right: Toe)     Patient location during evaluation: PACU Anesthesia Type: Regional and MAC Level of consciousness: awake Pain management: pain level controlled Vital Signs Assessment: post-procedure vital signs reviewed and stable Respiratory status: spontaneous breathing, nonlabored ventilation and respiratory function stable Cardiovascular status: stable and blood pressure returned to baseline Postop Assessment: no apparent nausea or vomiting Anesthetic complications: no   No notable events documented.  Last Vitals:  Vitals:   06/26/22 0930 06/26/22 1000  BP: (!) 140/88 (!) 139/96  Pulse: 61 66  Resp: 20 16  Temp:  36.6 C  SpO2: 94% 94%    Last Pain:  Vitals:   06/26/22 1000  TempSrc:   PainSc: 0-No pain                 Nilda Simmer

## 2022-06-26 NOTE — Progress Notes (Signed)
Assisted Dr. Jennifer Allan with right, popliteal, ultrasound guided block. Side rails up, monitors on throughout procedure. See vital signs in flow sheet. Tolerated Procedure well. 

## 2022-06-26 NOTE — H&P (View-Only) (Signed)
Assisted Dr. Jennifer Allan with right, popliteal, ultrasound guided block. Side rails up, monitors on throughout procedure. See vital signs in flow sheet. Tolerated Procedure well. 

## 2022-06-26 NOTE — Op Note (Signed)
Surgeon: Surgeon(s): Felipa Furnace, DPM  Assistants: None Pre-operative diagnosis: ARTHRITIS, HALLUX RIGIDUS  Post-operative diagnosis: same Procedure: Procedure(s) (LRB): ARTHRODESIS METATARSALPHALANGEAL JOINT (MTPJ) (Right)  Pathology: * No specimens in log *  Pertinent Intra-op findings:  severs osteoarthritis noted Anesthesia: Monitor Anesthesia Care  Hemostasis:  Total Tourniquet Time Documented: Calf (Right) - 47 minutes Total: Calf (Right) - 47 minutes  EBL: 50 cc  Materials: Crossroads plates and screws x 4, 3-0 and 4-0 Monocryl and 3-0 prolene Injectables: none Complications: None  Indications for surgery: A 62 y.o. male presents with Right painful first metatarsophalangeal joint. Patient has failed all conservative therapy including but not limited to injection, offloading,. She wishes to have surgical correction of the foot/deformity. It was determined that patient would benefit from Right first metatarsophalangeal joint arthrodesis. Informed surgical risk consent was reviewed and read aloud to the patient.  I reviewed the films.  I have discussed my findings with the patient in great detail.  I have discussed all risks including but not limited to infection, stiffness, scarring, limp, disability, deformity, damage to blood vessels and nerves, numbness, poor healing, need for braces, arthritis, chronic pain, amputation, death.  All benefits and realistic expectations discussed in great detail.  I have made no promises as to the outcome.  I have provided realistic expectations.  I have offered the patient a 2nd opinion, which they have declined and assured me they preferred to proceed despite the risks   Procedure in detail: The patient was both verbally and visually identified by myself, the nursing staff, and anesthesia staff in the preoperative holding area. They were then transferred to the operating room and placed on the operative table in supine position.  Attention was  directed to the dorsal aspect of the Right first metatarsophalangeal joint where a linear skin incision approximately 6cm long was made in the skin using a #15 blade. This incision was carried down through the subcutaneous tissue taking care to clamp and cauterize all neurovascular structures as necessary.  The capsule of 1st MPJ was identified. A linear capsulotomy was then performed in-line with the original skin incision and the capsule was reflected both medially and laterally to expose the head of the first metatarsal and the base of the proximal phalanx. The articular surface of the 1st metatarsal head was noted to show evidence of []  degeneration under direct visualization. The sagittal saw was then used to resect the dorsal, medial, and lateral prominences off of the 1st metatarsal. A rongeur was utilized to remove the dorsal exostosis of the proximal phalanx. A curette was used to remove some of the cartilage, and the remaining cartilage was removed using a burr until punctate bleeding could be noted on the metatarsal head and base of proximal phalanx. The site as flushed with copious amounts of sterile saline.  The distal aspect of the 1st metatarsal and the base of the proximal phalanx were then subchondrally drilled utilizing a pineapple burr. A Temporary K wire was then placed from distal-medial to proximal-lateral across the 1st MPJ, and the position was confirmed to be satisfactory utilizing fluoroscopy.. A dorsal right1st MPJ fusion locking plate was then applied and secured with two olive wires.  A crossroads staple was utilized through the dorsal plate to hold the fixation.The position was confirmed to be satisfactory with fluoroscopy, and two screws were placed both proximally and distally for a total of four Nonlocking/locking screws utilized. The 1st MPJ was then stressed intraoperatively and no motion or gapping was noted  across the fusion site. Final imaging via fluoroscopy was taken to  confirm adequate placement and rectus position of the hallux. The surgical site was copiously irrigated with sterile saline. The capsule was then reapproximated with 3-0 Monocortical in a simple interrupted fashion. The subcutaneous tissue was then reapproximated with 4-0 monocryl in a running fashion. The subcuticular was performed utilizing 5-0 Monocryl.  At the conclusion of the procedure the patient was awoken from anesthesia and found to have tolerated the procedure well any complications. There were transferred to PACU with vital signs stable and vascular status intact.  Nicholes Rough, DPM

## 2022-06-26 NOTE — Transfer of Care (Signed)
Immediate Anesthesia Transfer of Care Note  Patient: Nathan Sanchez  Procedure(s) Performed: Procedure(s) (LRB): ARTHRODESIS METATARSALPHALANGEAL JOINT (MTPJ) (Right)  Patient Location: PACU  Anesthesia Type: MAC  Level of Consciousness: awake, alert  and oriented  Airway & Oxygen Therapy: Patient Spontanous Breathing and Patient connected to nasal cannula oxygen  Post-op Assessment: Report given to PACU RN and Post -op Vital signs reviewed and stable  Post vital signs: Reviewed and stable  Complications: No apparent anesthesia complications  Last Vitals:  Vitals Value Taken Time  BP 139/89 06/26/22 0856  Temp    Pulse 63 06/26/22 0859  Resp 15 06/26/22 0859  SpO2 96 % 06/26/22 0859  Vitals shown include unvalidated device data.  Last Pain:  Vitals:   06/26/22 0539  TempSrc: Oral         Complications: No notable events documented.

## 2022-06-26 NOTE — Anesthesia Procedure Notes (Signed)
Procedure Name: MAC Date/Time: 06/26/2022 7:40 AM  Performed by: Mechele Claude, CRNAPre-anesthesia Checklist: Patient identified, Emergency Drugs available, Suction available and Patient being monitored Oxygen Delivery Method: Simple face mask Placement Confirmation: positive ETCO2 and breath sounds checked- equal and bilateral

## 2022-06-26 NOTE — Anesthesia Procedure Notes (Signed)
Anesthesia Regional Block: Popliteal block   Pre-Anesthetic Checklist: , timeout performed,  Correct Patient, Correct Site, Correct Laterality,  Correct Procedure, Correct Position, site marked,  Risks and benefits discussed,  Surgical consent,  Pre-op evaluation,  At surgeon's request and post-op pain management  Laterality: Right  Prep: chloraprep       Needles:  Injection technique: Single-shot  Needle Type: Echogenic Stimulator Needle     Needle Length: 9cm  Needle Gauge: 21     Additional Needles:   Procedures:,,,, ultrasound used (permanent image in chart),,    Narrative:  Start time: 06/26/2022 7:07 AM End time: 06/26/2022 7:10 AM Injection made incrementally with aspirations every 5 mL.  Performed by: Personally  Anesthesiologist: Nilda Simmer, MD  Additional Notes: Discussed risks and benefits of nerve block including, but not limited to, prolonged and/or permanent nerve injury involving sensory and/or motor function. Monitors were applied and a time-out was performed. The nerve and associated structures were visualized under ultrasound guidance. After negative aspiration, local anesthetic was slowly injected around the nerve. There was no evidence of high pressure during the procedure. There were no paresthesias. VSS remained stable and the patient tolerated the procedure well.

## 2022-06-26 NOTE — Interval H&P Note (Signed)
History and Physical Interval Note:  06/26/2022 7:29 AM  Nathan Sanchez  has presented today for surgery, with the diagnosis of ARTHRITIS, HALLUX RIGIDUS.  The various methods of treatment have been discussed with the patient and family. After consideration of risks, benefits and other options for treatment, the patient has consented to  Procedure(s): ARTHRODESIS METATARSALPHALANGEAL JOINT (MTPJ) (Right) as a surgical intervention.  The patient's history has been reviewed, patient examined, no change in status, stable for surgery.  I have reviewed the patient's chart and labs.  Questions were answered to the patient's satisfaction.     Felipa Furnace

## 2022-06-27 ENCOUNTER — Encounter (HOSPITAL_BASED_OUTPATIENT_CLINIC_OR_DEPARTMENT_OTHER): Payer: Self-pay | Admitting: Podiatry

## 2022-07-05 ENCOUNTER — Ambulatory Visit (INDEPENDENT_AMBULATORY_CARE_PROVIDER_SITE_OTHER): Payer: Medicare HMO

## 2022-07-05 ENCOUNTER — Ambulatory Visit (INDEPENDENT_AMBULATORY_CARE_PROVIDER_SITE_OTHER): Payer: Medicare HMO | Admitting: Podiatry

## 2022-07-05 VITALS — BP 136/74

## 2022-07-05 DIAGNOSIS — Z9889 Other specified postprocedural states: Secondary | ICD-10-CM

## 2022-07-05 DIAGNOSIS — M19071 Primary osteoarthritis, right ankle and foot: Secondary | ICD-10-CM | POA: Diagnosis not present
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
## 2022-07-11 ENCOUNTER — Other Ambulatory Visit: Payer: Self-pay

## 2022-07-13 ENCOUNTER — Telehealth: Payer: Self-pay | Admitting: Podiatry

## 2022-07-13 MED ORDER — OXYCODONE-ACETAMINOPHEN 5-325 MG PO TABS: 1.0000 | ORAL_TABLET | ORAL | 0 refills | Status: DC | PRN

## 2022-07-13 NOTE — Telephone Encounter (Signed)
[place] called to ask Dr Posey Pronto if he still wanted to prescribe oxycodone 5/325 since pt just had the same medication picked up prescibed by Dr Nancy Fetter for Oxycodone 10mg  for 120 tablets on 06/29/22.   Dr Posey Pronto states to go ahead and fill it since pt just had surgery and he is managing his acute pain.  Pharmacy is aware.

## 2022-07-19 ENCOUNTER — Encounter: Payer: Medicare Other | Admitting: Podiatry

## 2022-07-19 ENCOUNTER — Ambulatory Visit (INDEPENDENT_AMBULATORY_CARE_PROVIDER_SITE_OTHER): Payer: Medicare HMO

## 2022-07-19 DIAGNOSIS — M19071 Primary osteoarthritis, right ankle and foot: Secondary | ICD-10-CM

## 2022-07-19 NOTE — Progress Notes (Signed)
Patient presents today for post op visit # 2, patient of Dr. Posey Pronto.   POV # 2 DOS 06/26/22 RT foot MPJ Fusion    Sutures removed today without complication. Patient states he is doing well. Dr. Posey Pronto did come in to see the patient and advised patient that he can began to shower at this time and get the right foot wet. Dr. Posey Pronto also stated patient can began to put weight on the right foot and transition to a supportive regular shoe in 2 weeks. Patient verbalized understanding.    Reviewed icing and elevation. Patient will follow up with Dr. Posey Pronto as needed.

## 2022-11-01 ENCOUNTER — Ambulatory Visit: Payer: Medicare HMO | Admitting: Podiatry

## 2022-11-01 ENCOUNTER — Ambulatory Visit (INDEPENDENT_AMBULATORY_CARE_PROVIDER_SITE_OTHER): Payer: Medicare HMO

## 2022-11-01 DIAGNOSIS — M19079 Primary osteoarthritis, unspecified ankle and foot: Secondary | ICD-10-CM

## 2022-11-01 DIAGNOSIS — M19071 Primary osteoarthritis, right ankle and foot: Secondary | ICD-10-CM | POA: Diagnosis not present
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
## 2023-03-07 ENCOUNTER — Ambulatory Visit: Payer: Medicare HMO | Admitting: Podiatry

## 2023-04-25 IMAGING — MR MR CERVICAL SPINE W/O CM
5 series · 32 of 48 positions shown · non-contrast
Comparison: MR cervical 03/30/2008; X-ray cervical 08/13/2009.

CLINICAL DATA: Neck pain.  Left upper extremity radicular pain.

EXAM:
MRI CERVICAL SPINE WITHOUT CONTRAST
TECHNIQUE: Multiplanar, multisequence MR imaging of the cervical spine was
performed. No intravenous contrast was administered.

[Series 5: t2_tse_sag_fast · sagittal · 3.0mm · 0.43mm/px · 6 of 15 slices shown]
[im 1/15]
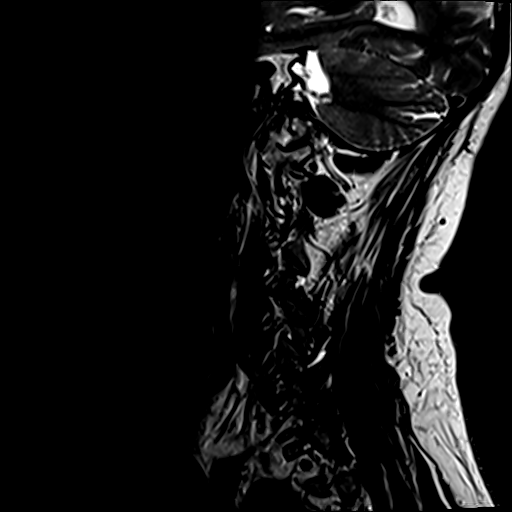
[im 3/15]
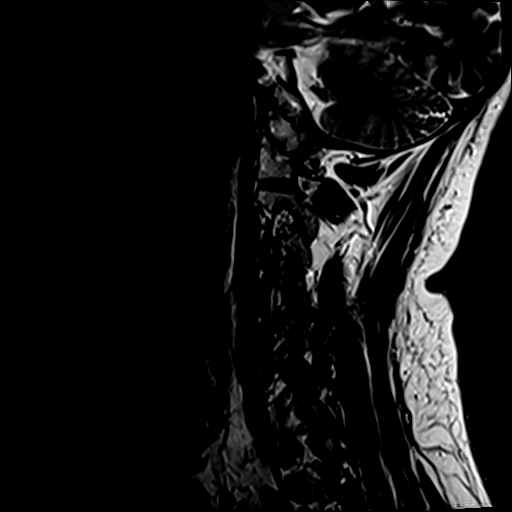
[im 6/15]
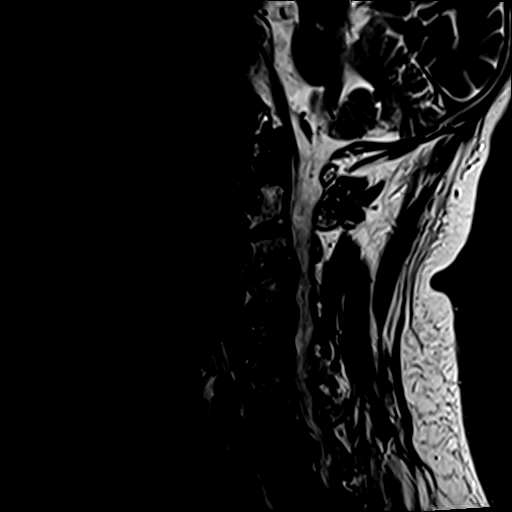
[im 9/15]
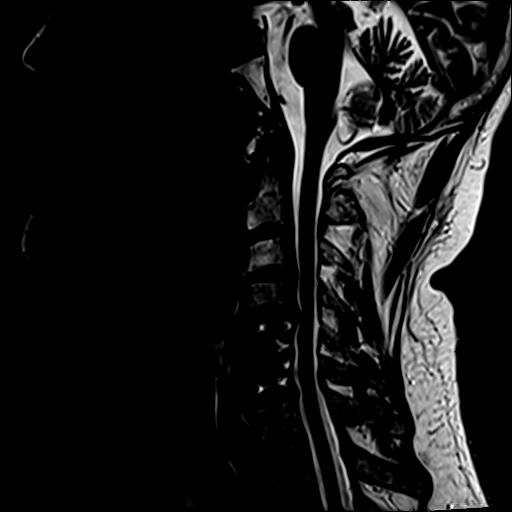
[im 12/15]
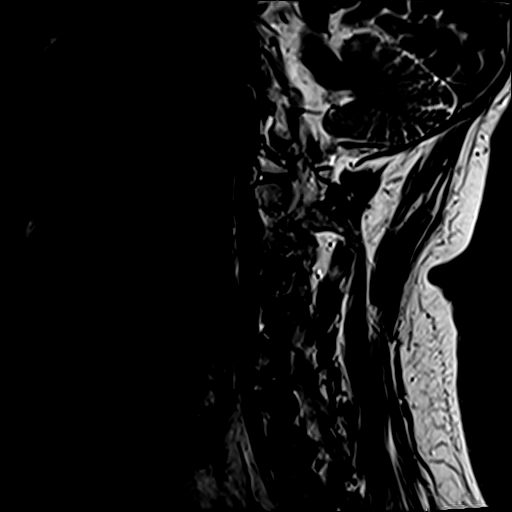
[im 15/15]
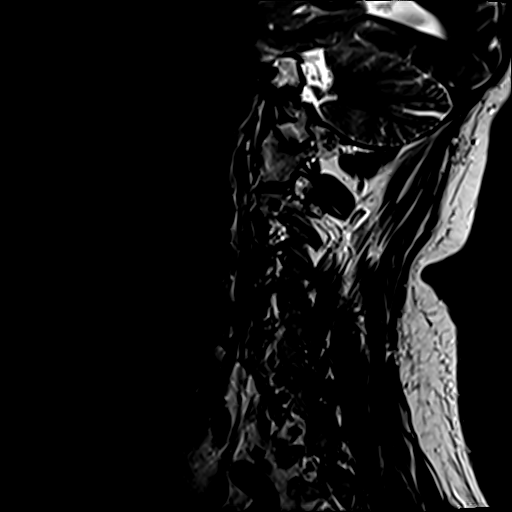

[Series 6: t1_tse_sag_fast · sagittal · 3.0mm · 0.43mm/px · 7 of 15 slices shown]
[im 1/15]
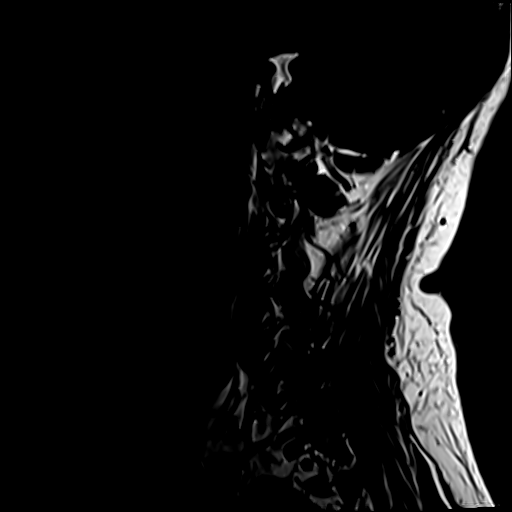
[im 3/15]
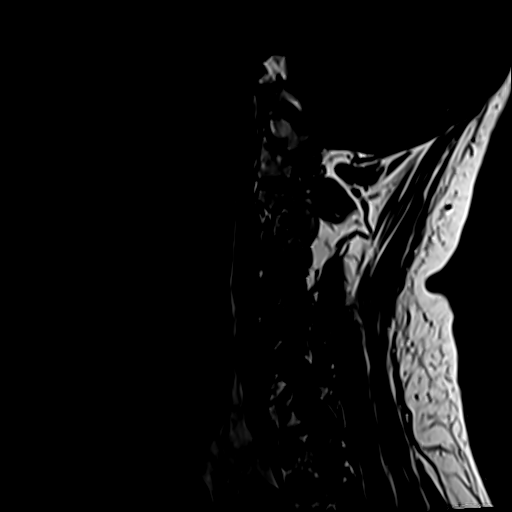
[im 5/15]
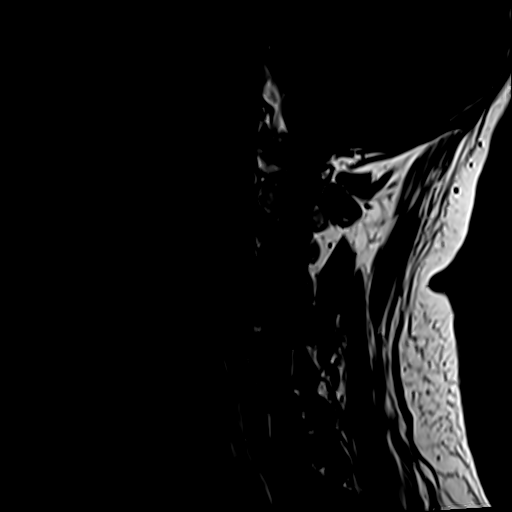
[im 8/15]
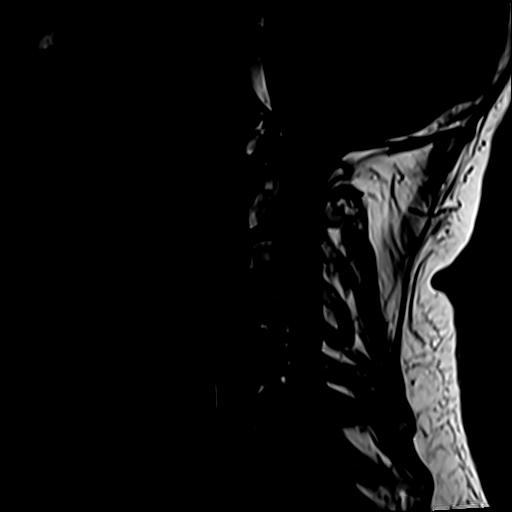
[im 10/15]
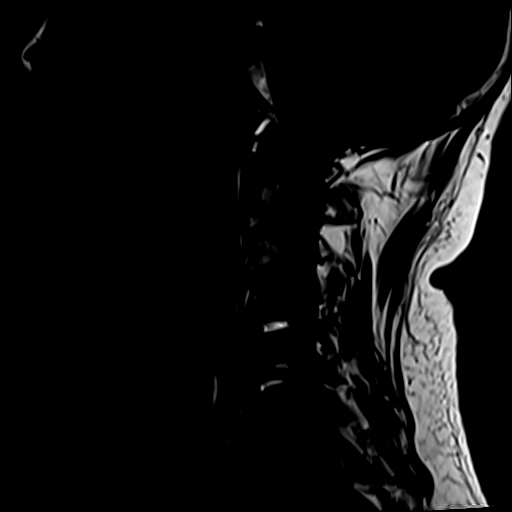
[im 12/15]
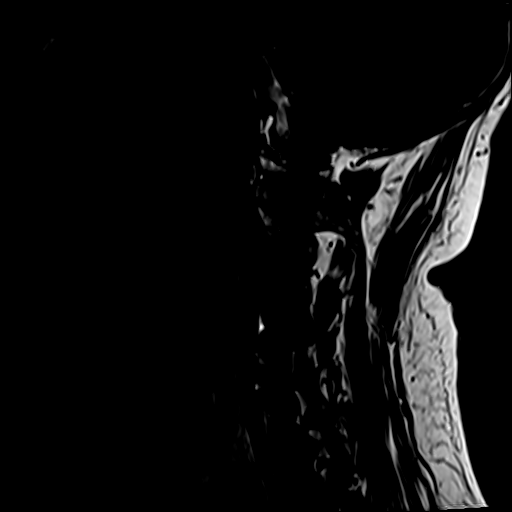
[im 15/15]
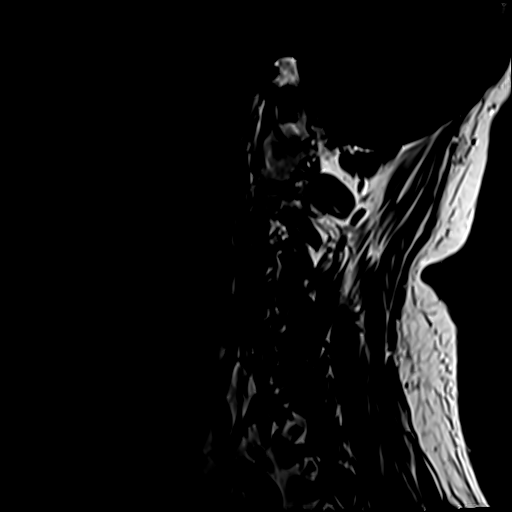

[Series 7: STIR · sagittal · 3.0mm · 0.86mm/px · 7 of 15 slices shown]
[im 1/15]
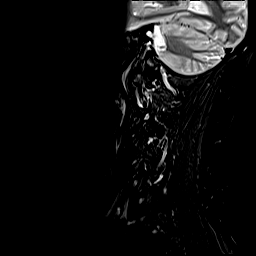
[im 3/15]
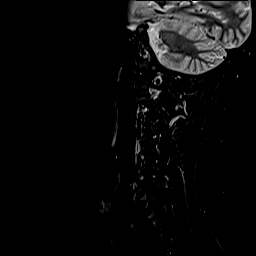
[im 5/15]
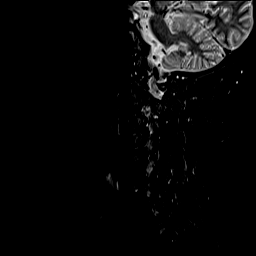
[im 8/15]
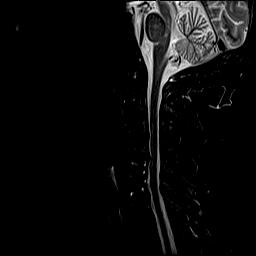
[im 10/15]
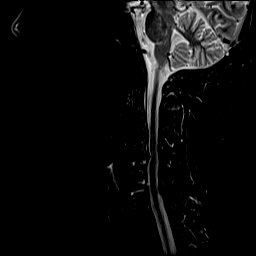
[im 12/15]
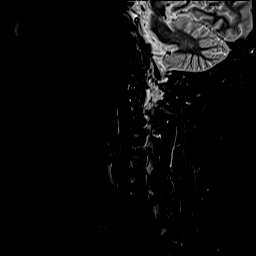
[im 15/15]
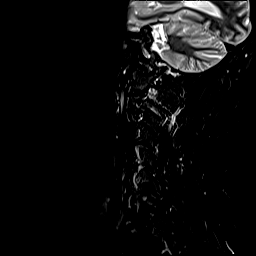

[Series 8: t2_tse_tra_fast · axial · 3.0mm · 0.78mm/px · z∈[-114,-15]mm · 8 of 32 slices shown]
[im 1/32]
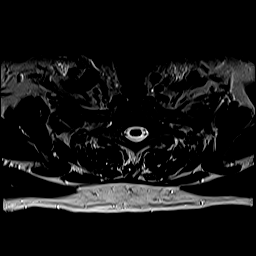
[im 5/32]
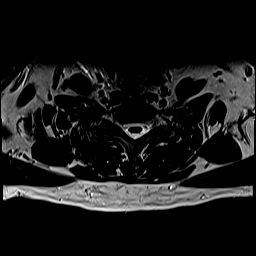
[im 10/32]
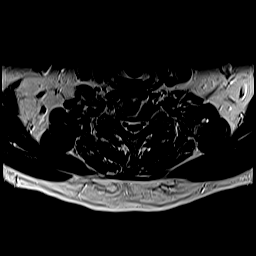
[im 15/32]
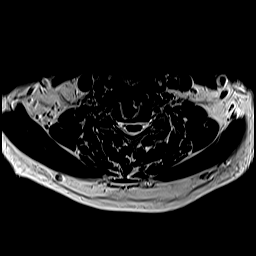
[im 17/32]
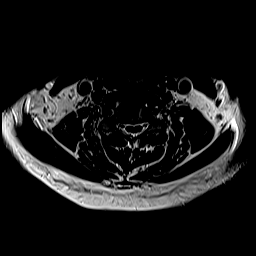
[im 22/32]
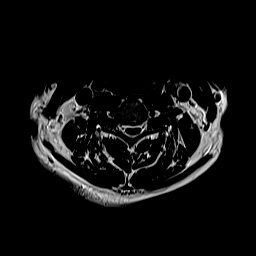
[im 27/32]
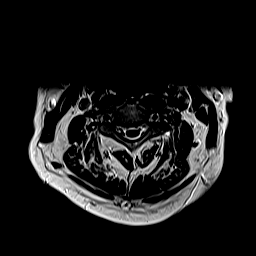
[im 32/32]
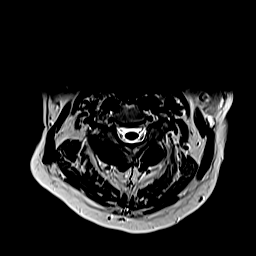

[Series 9: GRE · axial · 3.0mm · 0.78mm/px · z∈[-114,-69]mm · 4 of 32 slices shown]
[im 1/32]
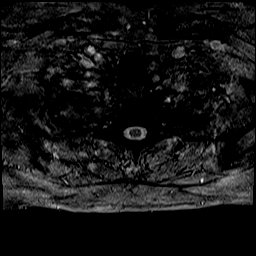
[im 5/32]
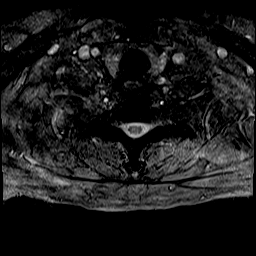
[im 10/32]
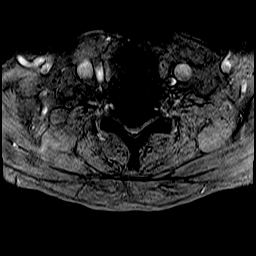
[im 15/32]
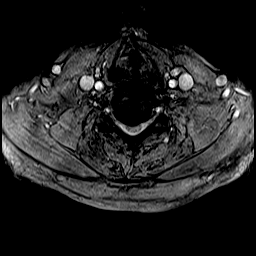

[32 of 48 positions shown; findings below may reference images not displayed]

FINDINGS: Alignment: Straightening of the normal cervical lordosis.

Vertebrae: Distant ACDF C5-6.

Cord: No cord compression or focal cord lesion.

Posterior Fossa, vertebral arteries, paraspinal tissues: Negative

Disc levels:

Foramen magnum is widely patent. C1-2 is normal. C2-3 is normal
except for minimal facet degeneration on the left but no stenosis or
edema.

C3-4: Endplate osteophytes and mild bulging of the disc. No
compressive canal stenosis. Bilateral foraminal narrowing right more
than left. This has worsened since 3660.

C4-5: Endplate osteophytes and bulging of the disc. Narrowing of the
ventral subarachnoid space. AP diameter of the canal measures
mm. No cord compression. Mild facet degeneration without edema.
Bilateral foraminal narrowing. This has worsened since 3660.

C5-6: Distant ACDF has a good appearance with wide patency of the
canal and foramina.

C6-7: Endplate osteophytes and bulging of the disc. Mild facet
degeneration and hypertrophy. No compressive narrowing of the canal
or foramina. Findings have worsened slightly since 3660.

C7-T1: No disc abnormality. No significant facet finding. No
stenosis.
IMPRESSION: Distant ACDF at C5-6 continues to have a good appearance.

Worsening of degenerative changes at C3-4, C4-5 and C6-7 when
compared to the study of 3660. There is no compressive central canal
stenosis, though at C4-5 there is narrowing of the ventral
subarachnoid space. There is bilateral foraminal narrowing at the
C3-4 and C4-5 that could possibly cause neural compression. No
likely neural compression at the C6-7 level.

## 2023-09-27 ENCOUNTER — Ambulatory Visit: Admitting: Orthopaedic Surgery

## 2023-10-03 ENCOUNTER — Ambulatory Visit (INDEPENDENT_AMBULATORY_CARE_PROVIDER_SITE_OTHER): Admitting: Orthopaedic Surgery

## 2023-10-03 ENCOUNTER — Encounter: Payer: Self-pay | Admitting: Orthopaedic Surgery

## 2023-10-03 VITALS — BP 125/72 | HR 67 | Ht 73.0 in | Wt 222.0 lb

## 2023-10-03 DIAGNOSIS — R202 Paresthesia of skin: Secondary | ICD-10-CM | POA: Diagnosis not present

## 2023-10-03 NOTE — Progress Notes (Signed)
 I have carpal tunnel.  He had release of the carpal tunnel on the right by me years ago and he did well.  He did not have the left side done.  The left side now is hurting and is numb most of the time.  He has tried medicine and a brace with no help.  He wants to have surgery now.  He has decreased sensation of the left median nerve on the left hand.  ROM is full.  He has slight atrophy of the thenar muscles.  Encounter Diagnosis  Name Primary?   Left hand paresthesia Yes   I will get EMGs.  I told him I do not do surgery anymore and will refer him to one of the other doctors here in the practice for surgery.  Call if any problem.  Precautions discussed.  Electronically Signed Pleasant Brilliant, MD 4/16/20258:51 AM

## 2023-10-03 NOTE — Patient Instructions (Signed)
 We are referring you to [facility] from [facility] address is [address] [place] The phone number is [phone]  The office will call you with an appointment Dr. Alvester Morin

## 2023-10-10 ENCOUNTER — Ambulatory Visit (INDEPENDENT_AMBULATORY_CARE_PROVIDER_SITE_OTHER): Admitting: Physical Medicine and Rehabilitation

## 2023-10-10 DIAGNOSIS — R531 Weakness: Secondary | ICD-10-CM | POA: Diagnosis not present

## 2023-10-10 DIAGNOSIS — R202 Paresthesia of skin: Secondary | ICD-10-CM | POA: Diagnosis not present

## 2023-10-10 DIAGNOSIS — M25522 Pain in left elbow: Secondary | ICD-10-CM | POA: Diagnosis not present

## 2023-10-10 DIAGNOSIS — M79642 Pain in left hand: Secondary | ICD-10-CM

## 2023-10-10 NOTE — Progress Notes (Signed)
 Pain Scale   Average Pain 3 Patient advising he has numbness and tingling constantly in his left hand also C/O weakness when trying to grasp things. Patient states at times he does drop thing when using left hand. Patient advises he is Right hand dominate. Patient states he has increased pain at night.    +Driver, -BT, -Dye Allergies.

## 2023-10-11 NOTE — Procedures (Signed)
 EMG & NCV Findings: Evaluation of the left median motor nerve showed prolonged distal onset latency (8.0 ms), reduced amplitude (3.3 mV), and decreased conduction velocity (Elbow-Wrist, 39 m/s).  The left ulnar motor nerve showed decreased conduction velocity (B Elbow-Wrist, 52 m/s) and decreased conduction velocity (A Elbow-B Elbow, 50 m/s).  The left median (across palm) sensory nerve showed no response (Palm), prolonged distal peak latency (5.4 ms), and reduced amplitude (7.0 V).  The left ulnar sensory nerve showed prolonged distal peak latency (3.8 ms), reduced amplitude (14.5 V), and decreased conduction velocity (Wrist-5th Digit, 37 m/s).  All remaining nerves (as indicated in the following tables) were within normal limits.    Needle evaluation of the left abductor pollicis brevis muscle showed increased insertional activity and increased spontaneous activity.  All remaining muscles (as indicated in the following table) showed no evidence of electrical instability.    Impression: The above electrodiagnostic study is ABNORMAL and reveals evidence of a severe left median nerve entrapment at the wrist (carpal tunnel syndrome) affecting sensory and motor components.   There is no significant electrodiagnostic evidence of any other focal nerve entrapment, brachial plexopathy or cervical radiculopathy.   Recommendations: 1.  Follow-up with referring physician. 2.  Continue current management of symptoms. 3.  Suggest surgical evaluation.  ___________________________ Collin Deal FAAPMR Board Certified, American Board of Physical Medicine and Rehabilitation    Nerve Conduction Studies Anti Sensory Summary Table   Stim Site NR Peak (ms) Norm Peak (ms) P-T Amp (V) Norm P-T Amp Site1 Site2 Delta-P (ms) Dist (cm) Vel (m/s) Norm Vel (m/s)  Left Median Acr Palm Anti Sensory (2nd Digit)  31.5C  Wrist    *5.4 <3.6 *7.0 >10 Wrist Palm  0.0    Palm *NR  <2.0          Left Radial Anti Sensory  (Base 1st Digit)  31C  Wrist    2.6 <3.1 5.1  Wrist Base 1st Digit 2.6 0.0    Left Ulnar Anti Sensory (5th Digit)  31.8C  Wrist    *3.8 <3.7 *14.5 >15.0 Wrist 5th Digit 3.8 14.0 *37 >38   Motor Summary Table   Stim Site NR Onset (ms) Norm Onset (ms) O-P Amp (mV) Norm O-P Amp Site1 Site2 Delta-0 (ms) Dist (cm) Vel (m/s) Norm Vel (m/s)  Left Median Motor (Abd Poll Brev)  31.1C  Wrist    *8.0 <4.2 *3.3 >5 Elbow Wrist 6.2 24.0 *39 >50  Elbow    14.2  3.0         Left Ulnar Motor (Abd Dig Min)  31.2C  Wrist    3.4 <4.2 5.7 >3 B Elbow Wrist 4.4 23.0 *52 >53  B Elbow    7.8  5.2  A Elbow B Elbow 2.0 10.0 *50 >53  A Elbow    9.8  5.3          EMG   Side Muscle Nerve Root Ins Act Fibs Psw Amp Dur Poly Recrt Int Deatra Face Comment  Left Abd Poll Brev Median C8-T1 *Incr *3+ *3+ Nml Nml 0 Nml Nml good MUAP  Left 1stDorInt Ulnar C8-T1 Nml Nml Nml Nml Nml 0 Nml Nml   Left PronatorTeres Median C6-7 Nml Nml Nml Nml Nml 0 Nml Nml   Left Biceps Musculocut C5-6 Nml Nml Nml Nml Nml 0 Nml Nml   Left Deltoid Axillary C5-6 Nml Nml Nml Nml Nml 0 Nml Nml     Nerve Conduction Studies Anti Sensory Left/Right Comparison   Stim  Site L Lat (ms) R Lat (ms) L-R Lat (ms) L Amp (V) R Amp (V) L-R Amp (%) Site1 Site2 L Vel (m/s) R Vel (m/s) L-R Vel (m/s)  Median Acr Palm Anti Sensory (2nd Digit)  31.5C  Wrist *5.4   *7.0   Wrist Palm     Palm             Radial Anti Sensory (Base 1st Digit)  31C  Wrist 2.6   5.1   Wrist Base 1st Digit     Ulnar Anti Sensory (5th Digit)  31.8C  Wrist *3.8   *14.5   Wrist 5th Digit *37     Motor Left/Right Comparison   Stim Site L Lat (ms) R Lat (ms) L-R Lat (ms) L Amp (mV) R Amp (mV) L-R Amp (%) Site1 Site2 L Vel (m/s) R Vel (m/s) L-R Vel (m/s)  Median Motor (Abd Poll Brev)  31.1C  Wrist *8.0   *3.3   Elbow Wrist *39    Elbow 14.2   3.0         Ulnar Motor (Abd Dig Min)  31.2C  Wrist 3.4   5.7   B Elbow Wrist *52    B Elbow 7.8   5.2   A Elbow B Elbow *50    A Elbow  9.8   5.3            Waveforms:
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
## 2023-10-17 ENCOUNTER — Encounter: Payer: Self-pay | Admitting: Orthopedic Surgery

## 2023-10-17 ENCOUNTER — Ambulatory Visit (INDEPENDENT_AMBULATORY_CARE_PROVIDER_SITE_OTHER): Admitting: Orthopedic Surgery

## 2023-10-17 VITALS — BP 143/73 | HR 71 | Ht 74.0 in | Wt 220.0 lb

## 2023-10-17 DIAGNOSIS — Z01818 Encounter for other preprocedural examination: Secondary | ICD-10-CM

## 2023-10-17 DIAGNOSIS — G5602 Carpal tunnel syndrome, left upper limb: Secondary | ICD-10-CM
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
## 2023-10-17 NOTE — Patient Instructions (Addendum)
 Plan for surgery May 12th  Last dose of Ozempic on 5/4; do not take the dose of ozempic on 5/11    Your surgery will be at [facility] by Dr Ernesta Heading  The hospital will contact you with a preoperative appointment to discuss Anesthesia.  Please arrive on time or 15 minutes early for the preoperative appointment, they have a very tight schedule if you are late or do not come in your surgery will be cancelled.  The phone number is [phone]. Please bring your medications with you for the appointment. They will tell you the arrival time and medication instructions when you have your preoperative evaluation. Do not wear nail polish the day of your surgery and if you take Phentermine you need to stop this medication ONE WEEK prior to your surgery. If you take Invokana, Farxiga, Jardiance, or Steglatro) - Hold 72 hours before the procedure.  If you take Ozempic,  Mounjaro, Bydureon or Trulicity do not take for 8 days before your surgery. If you take Victoza, Rybelsis, Saxenda or Adlyxi stop 24 hours before the procedure.  Please arrive at the hospital 2 hours before procedure if scheduled at 9:30 or later in the day or at the time the nurse tells you at your preoperative visit.   If you have my chart do not use the time given in my chart use the time given to you by the nurse during your preoperative visit.   Your surgery  time may change. Please be available for phone calls the day of your surgery and the day before. The Short Stay department may need to discuss changes about your surgery time. Not reaching the you could lead to procedure delays and possible cancellation.  You must have a ride home and someone to stay with you for 24 to 48 hours. The person taking you home will receive and sign for the your discharge instructions.  Please be prepared to give your support person's name and telephone number to Central Registration. Dr Phyllis Breeze will need that name and phone number post procedure.
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
## 2023-10-24 NOTE — Patient Instructions (Addendum)
 Nathan Sanchez  10/24/2023     @PREFPERIOPPHARMACY @   Your procedure is scheduled on  10/29/2023.   Report to [facility] at  0600 A.M.   Call this number if you have problems the morning of surgery:  [phone]  If you experience any cold or flu symptoms such as cough, fever, chills, shortness of breath, etc. between now and your scheduled surgery, please notify us  at the above number.   Remember:        Your last dose of ozempic should have been on 10/21/2023.        DO NOT take any medications for diabetes the morning of your procedure.   Do not eat after midnight.   You may drink clear liquids until  0330 am on 10/29/2023.    Clear liquids allowed are:                    Water , Juice (No red color; non-citric and without pulp; diabetics please choose diet or no sugar options), Carbonated beverages (diabetics please choose diet or no sugar options), Clear Tea (No creamer, milk, or cream, including half & half and powdered creamer), Black Coffee Only (No creamer, milk or cream, including half & half and powdered creamer), and Clear Sports drink (No red color; diabetics please choose diet or no sugar options)          At 0330 am on 10/29/2023 drink (1-12oz G2). You can have nothing else to drink after this.   Take these medicines the morning of surgery with A SIP OF WATER         baclofen, buspirone, oxycodone  (if needed), pregabalin, zofran  (if needed).    Do not wear jewelry, make-up or nail polish, including gel polish,  artificial nails, or any other type of covering on natural nails (fingers and  toes).  Do not wear lotions, powders, or perfumes, or deodorant.  Do not shave 48 hours prior to surgery.  Men may shave face and neck.  Do not bring valuables to the hospital.  [facility] is not responsible for any belongings or valuables.  Contacts, dentures or bridgework may not be worn into surgery.  Leave your suitcase in the car.  After surgery it may be brought to  your room.  For patients admitted to the hospital, discharge time will be determined by your treatment team.  Patients discharged the day of surgery will not be allowed to drive home and must have someone with them for 24 hours.    Special instructions:   DO NOT smoke tobacco or vape for 24 hours before your procedure.  Please read over the following fact sheets that you were given. Coughing and Deep Breathing, Surgical Site Infection Prevention, Anesthesia Post-op Instructions, and Care and Recovery After Surgery          Open Carpal Tunnel Release: What to Know After After open carpal tunnel release surgery, it's common to have pain, swelling, bruising, and stiffness in the wrist. Follow these instructions at home: Medicines Take your medicines only as told. You may need to take steps to help treat or prevent trouble pooping (constipation), such as: Taking medicines to help you poop. Eating foods high in fiber, like beans, whole grains, and fresh fruits and vegetables. Drinking more fluids as told. If you have a splint or brace that can be taken off: Wear the splint or brace as told. Take it off only if your health care provider  says you can. Check the skin around it every day. Tell your provider if you see problems. Loosen the splint or brace if your fingers tingle, are numb, or turn cold and blue. Keep the splint or brace clean. If the splint or brace isn't waterproof: Do not let it get wet. Cover it when you take a bath or shower. Use a cover that doesn't let any water  in. Bathing Do not take baths, swim, or use a hot tub until you're told it's OK. Ask if you can shower. Keep your bandage dry until your provider says it can be removed. Cover it when you take a bath or shower. Use a cover that doesn't let any water  in. Wound care  Take care of your cut from surgery as told. Make sure you: Wash your hands with soap and water  for at least 20 seconds before and after you  change your bandage. If you can't use soap and water , use hand sanitizer. Change your bandage. Leave stitches or skin glue alone. Leave tape strips alone unless you're told to take them off. You may trim the edges of the tape strips if they curl up. Check the area around your cut from surgery every day for signs of infection. Check for: Redness, swelling, or pain. Fluid or blood. Warmth. Pus or a bad smell. Pain Management  Use ice or an ice pack as told. If you have a splint or brace that you can take off, remove it only as told. Place a towel between your skin and the ice. Leave the ice on for 20 minutes, 2-3 times a day. If your skin turns red, take off the ice right away to prevent skin damage. The risk of damage is higher if you can't feel pain, heat, or cold. Move your fingers often to avoid stiffness and swelling. Raise your wrist above the level of your heart while you're sitting or lying down. Use pillows as needed. Activity Ask when it's safe to drive if you have a splint or brace on your hand. Do not lift with your affected hand until you're told it's OK. Avoid pulling and pushing with the injured arm. Do exercises as told. Ask what things are safe for you to do at home. Ask when you can go back to work or school. General instructions Do not smoke, vape or use nicotine or tobacco. Doing this can slow down healing. Keep all follow-up visits. You'll need to have your hand checked after surgery. Your provider may give you more instructions. Make sure you know what you can and can't do. Contact a health care provider if: You have signs of infection. You have a fever. You have pain that doesn't get better with medicine. Your carpal tunnel symptoms don't go away after 2 months. Your carpal tunnel symptoms go away and then come back. You have numbness that's getting worse. Get help right away if: Your fingers or hand turn cool or pale, or they change color. They may turn blue  or grey. You aren't able to move your fingers. You lose feeling in your fingers, hand, or arm. These symptoms may be an emergency. Call 911 right away. Do not wait to see if the symptoms will go away. Do not drive yourself to the hospital. This information is not intended to replace advice given to you by your health care provider. Make sure you discuss any questions you have with your health care provider. Document Revised: 03/05/2023 Document Reviewed: 03/05/2023 Elsevier Patient Education  2024  [employer]General Anesthesia, Adult, Care After The following information offers guidance on how to care for yourself after your procedure. Your health care provider may also give you more specific instructions. If you have problems or questions, contact your health care provider. What can I expect after the procedure? After the procedure, it is common for people to: Have pain or discomfort at the IV site. Have nausea or vomiting. Have a sore throat or hoarseness. Have trouble concentrating. Feel cold or chills. Feel weak, sleepy, or tired (fatigue). Have soreness and body aches. These can affect parts of the body that were not involved in surgery. Follow these instructions at home: For the time period you were told by your health care provider:  Rest. Do not participate in activities where you could fall or become injured. Do not drive or use machinery. Do not drink alcohol. Do not take sleeping pills or medicines that cause drowsiness. Do not make important decisions or sign legal documents. Do not take care of children on your own. General instructions Drink enough fluid to keep your urine pale yellow. If you have sleep apnea, surgery and certain medicines can increase your risk for breathing problems. Follow instructions from your health care provider about wearing your sleep device: Anytime you are sleeping, including during daytime naps. While taking prescription pain medicines,  sleeping medicines, or medicines that make you drowsy. Return to your normal activities as told by your health care provider. Ask your health care provider what activities are safe for you. Take over-the-counter and prescription medicines only as told by your health care provider. Do not use any products that contain nicotine or tobacco. These products include cigarettes, chewing tobacco, and vaping devices, such as e-cigarettes. These can delay incision healing after surgery. If you need help quitting, ask your health care provider. Contact a health care provider if: You have nausea or vomiting that does not get better with medicine. You vomit every time you eat or drink. You have pain that does not get better with medicine. You cannot urinate or have bloody urine. You develop a skin rash. You have a fever. Get help right away if: You have trouble breathing. You have chest pain. You vomit blood. These symptoms may be an emergency. Get help right away. Call 911. Do not wait to see if the symptoms will go away. Do not drive yourself to the hospital. Summary After the procedure, it is common to have a sore throat, hoarseness, nausea, vomiting, or to feel weak, sleepy, or fatigue. For the time period you were told by your health care provider, do not drive or use machinery. Get help right away if you have difficulty breathing, have chest pain, or vomit blood. These symptoms may be an emergency. This information is not intended to replace advice given to you by your health care provider. Make sure you discuss any questions you have with your health care provider. Document Revised: 09/02/2021 Document Reviewed: 09/02/2021 Elsevier Patient Education  2024 Else[employer] Inc.How to Use Chlorhexidine  at Home in the Shower Chlorhexidine  gluconate (CHG) is a germ-killing (antiseptic) wash that's used to clean the skin. It can get rid of the germs that normally live on the skin and can keep them away for  about 24 hours. If you're having surgery, you may be told to shower with CHG at home the night before surgery. This can help lower your risk for infection. To use CHG wash in the shower, follow the steps below. Supplies needed: CHG body wash.  Clean washcloth. Clean towel. How to use CHG in the shower Follow these steps unless you're told to use CHG in a different way: Start the shower. Use your normal soap and shampoo to wash your face and hair. Turn off the shower or move out of the shower stream. Pour CHG onto a clean washcloth. Do not use any type of brush or rough sponge. Start at your neck, washing your body down to your toes. Make sure you: Wash the part of your body where the surgery will be done for at least 1 minute. Do not scrub. Do not use CHG on your head or face unless your health care provider tells you to. If it gets into your ears or eyes, rinse them well with water . Do not wash your genitals with CHG. Wash your back and under your arms. Make sure to wash skin folds. Let the CHG sit on your skin for 1-2 minutes or as long as told. Rinse your entire body in the shower, including all body creases and folds. Turn off the shower. Dry off with a clean towel. Do not put anything on your skin afterward, such as powder, lotion, or perfume. Put on clean clothes or pajamas. If it's the night before surgery, sleep in clean sheets. General tips Use CHG only as told, and follow the instructions on the label. Use the full amount of CHG as told. This is often one bottle. Do not smoke and stay away from flames after using CHG. Your skin may feel sticky after using CHG. This is normal. The sticky feeling will go away as the CHG dries. Do not use CHG: If you have a chlorhexidine  allergy or have reacted to chlorhexidine  in the past. On open wounds or areas of skin that have broken skin, cuts, or scrapes. On babies younger than 62 months of age. Contact a health care provider if: You  have questions about using CHG. Your skin gets irritated or itchy. You have a rash after using CHG. You swallow any CHG. Call your local poison control center [phone] in the [place] Your eyes itch badly, or they become very red or swollen. Your hearing changes. You have trouble seeing. If you can't reach your provider, go to an urgent care or emergency room. Do not drive yourself. Get help right away if: You have swelling or tingling in your mouth or throat. You make high-pitched whistling sounds when you breathe, most often when you breathe out (wheeze). You have trouble breathing. These symptoms may be an emergency. Call 911 right away. Do not wait to see if the symptoms will go away. Do not drive yourself to the hospital. This information is not intended to replace advice given to you by your health care provider. Make sure you discuss any questions you have with your health care provider. Document Revised: 12/19/2022 Document Reviewed: 12/15/2021 Elsevier Patient Education  2024 [employer] Inc.How to Use an Incentive Spirometer An incentive spirometer is a tool that measures how well you are filling your lungs with each breath. Learning to take long, deep breaths using this tool can help you keep your lungs clear and active. This may help to reverse or lessen your chance of developing breathing (pulmonary) problems, especially infection. You may be asked to use a spirometer: After a surgery. If you have a lung problem or a history of smoking. After a long period of time when you have been unable to move or be active. If the spirometer includes an indicator to show  the highest number that you have reached, your health care provider or respiratory therapist will help you set a goal. Keep a log of your progress as told by your health care provider. What are the risks? Breathing too quickly may cause dizziness or cause you to pass out. Take your time so you do not get dizzy or  light-headed. If you are in pain, you may need to take pain medicine before doing incentive spirometry. It is harder to take a deep breath if you are having pain. How to use your incentive spirometer  Sit up on the edge of your bed or on a chair. Hold the incentive spirometer so that it is in an upright position. Before you use the spirometer, breathe out normally. Place the mouthpiece in your mouth. Make sure your lips are closed tightly around it. Breathe in slowly and as deeply as you can through your mouth, causing the piston or the ball to rise toward the top of the chamber. Hold your breath for 3-5 seconds, or for as long as possible. If the spirometer includes a coach indicator, use this to guide you in breathing. Slow down your breathing if the indicator goes above the marked areas. Remove the mouthpiece from your mouth and breathe out normally. The piston or ball will return to the bottom of the chamber. Rest for a few seconds, then repeat the steps 10 or more times. Take your time and take a few normal breaths between deep breaths so that you do not get dizzy or light-headed. Do this every 1-2 hours when you are awake. If the spirometer includes a goal marker to show the highest number you have reached (best effort), use this as a goal to work toward during each repetition. After each set of 10 deep breaths, cough a few times. This will help to make sure that your lungs are clear. If you have an incision on your chest or abdomen from surgery, place a pillow or a rolled-up towel firmly against the incision when you cough. This can help to reduce pain while taking deep breaths and coughing. General tips When you are able to get out of bed: Walk around often. Continue to take deep breaths and cough in order to clear your lungs. Keep using the incentive spirometer until your health care provider says it is okay to stop using it. If you have been in the hospital, you may be told to keep  using the spirometer at home. Contact a health care provider if: You are having difficulty using the spirometer. You have trouble using the spirometer as often as instructed. Your pain medicine is not giving enough relief for you to use the spirometer as told. You have a fever. Get help right away if: You develop shortness of breath. You develop a cough with bloody mucus from the lungs. You have fluid or blood coming from an incision site after you cough. Summary An incentive spirometer is a tool that can help you learn to take long, deep breaths to keep your lungs clear and active. You may be asked to use a spirometer after a surgery, if you have a lung problem or a history of smoking, or if you have been inactive for a long period of time. Use your incentive spirometer as instructed every 1-2 hours while you are awake. If you have an incision on your chest or abdomen, place a pillow or a rolled-up towel firmly against your incision when you cough. This will help to  reduce pain. Get help right away if you have shortness of breath, you cough up bloody mucus, or blood comes from your incision when you cough. This information is not intended to replace advice given to you by your health care provider. Make sure you discuss any questions you have with your health care provider. Document Revised: 04/13/2023 Document Reviewed: 04/13/2023 Elsevier Patient Education  2024 [employer].

## 2023-10-25 ENCOUNTER — Emergency Department (HOSPITAL_COMMUNITY)
Admission: EM | Admit: 2023-10-25 | Discharge: 2023-10-25 | Disposition: A | Discharge status: Home / Self Care | Attending: Emergency Medicine | Admitting: Emergency Medicine

## 2023-10-25 ENCOUNTER — Telehealth: Payer: Self-pay

## 2023-10-25 ENCOUNTER — Encounter (HOSPITAL_COMMUNITY): Payer: Self-pay

## 2023-10-25 ENCOUNTER — Emergency Department (HOSPITAL_COMMUNITY)

## 2023-10-25 ENCOUNTER — Other Ambulatory Visit: Payer: Self-pay

## 2023-10-25 ENCOUNTER — Encounter (HOSPITAL_COMMUNITY)
Admission: RE | Admit: 2023-10-25 | Discharge: 2023-10-25 | Disposition: A | Discharge status: Home / Self Care | Source: Ambulatory Visit | Attending: Orthopedic Surgery | Admitting: Orthopedic Surgery

## 2023-10-25 VITALS — BP 104/60 | HR 69 | Temp 98.0°F | Resp 18 | Ht 74.0 in | Wt 220.0 lb

## 2023-10-25 DIAGNOSIS — Z01818 Encounter for other preprocedural examination: Secondary | ICD-10-CM | POA: Insufficient documentation

## 2023-10-25 DIAGNOSIS — E871 Hypo-osmolality and hyponatremia: Secondary | ICD-10-CM | POA: Insufficient documentation

## 2023-10-25 DIAGNOSIS — E119 Type 2 diabetes mellitus without complications: Secondary | ICD-10-CM | POA: Insufficient documentation

## 2023-10-25 DIAGNOSIS — I10 Essential (primary) hypertension: Secondary | ICD-10-CM | POA: Insufficient documentation

## 2023-10-25 DIAGNOSIS — Z7984 Long term (current) use of oral hypoglycemic drugs: Secondary | ICD-10-CM | POA: Insufficient documentation

## 2023-10-25 DIAGNOSIS — Z79899 Other long term (current) drug therapy: Secondary | ICD-10-CM | POA: Diagnosis not present

## 2023-10-25 DIAGNOSIS — R799 Abnormal finding of blood chemistry, unspecified: Secondary | ICD-10-CM | POA: Diagnosis present

## 2023-10-25 LAB — BASIC METABOLIC PANEL WITH GFR
Anion gap: 10 (ref 5–15)
Anion gap: 9 (ref 5–15)
BUN: 12 mg/dL (ref 8–23)
BUN: 12 mg/dL (ref 8–23)
CO2: 25 mmol/L (ref 22–32)
CO2: 29 mmol/L (ref 22–32)
Calcium: 9.1 mg/dL (ref 8.9–10.3)
Calcium: 9.3 mg/dL (ref 8.9–10.3)
Chloride: 91 mmol/L — ABNORMAL LOW (ref 98–111)
Chloride: 92 mmol/L — ABNORMAL LOW (ref 98–111)
Creatinine, Ser: 1.23 mg/dL (ref 0.61–1.24)
Creatinine, Ser: 1.26 mg/dL — ABNORMAL HIGH (ref 0.61–1.24)
GFR, Estimated: >60 mL/min (ref >60)
GFR, Estimated: >60 mL/min (ref >60)
Glucose, Bld: 162 mg/dL — ABNORMAL HIGH (ref 70–99)
Glucose, Bld: 106 mg/dL — ABNORMAL HIGH (ref 70–99)
Potassium: 3.9 mmol/L (ref 3.5–5.1)
Potassium: 4.2 mmol/L (ref 3.5–5.1)
Sodium: 126 mmol/L — ABNORMAL LOW (ref 135–145)
Sodium: 130 mmol/L — ABNORMAL LOW (ref 135–145)

## 2023-10-25 LAB — CBC
HCT: 42.5 % (ref 39.0–52.0)
HCT: 44.2 % (ref 39.0–52.0)
Hemoglobin: 15 g/dL (ref 13.0–17.0)
Hemoglobin: 15.1 g/dL (ref 13.0–17.0)
MCH: 33.7 pg (ref 26.0–34.0)
MCH: 32.4 pg (ref 26.0–34.0)
MCHC: 35.3 g/dL (ref 30.0–36.0)
MCHC: 34.2 g/dL (ref 30.0–36.0)
MCV: 95.5 fL (ref 80.0–100.0)
MCV: 94.8 fL (ref 80.0–100.0)
Platelets: 275 10*3/uL (ref 150–400)
Platelets: 262 10*3/uL (ref 150–400)
RBC: 4.45 MIL/uL (ref 4.22–5.81)
RBC: 4.66 MIL/uL (ref 4.22–5.81)
RDW: 12.4 % (ref 11.5–15.5)
RDW: 12.4 % (ref 11.5–15.5)
WBC: 5.1 10*3/uL (ref 4.0–10.5)
WBC: 6.3 10*3/uL (ref 4.0–10.5)
nRBC: 0 % (ref 0.0–0.2)
nRBC: 0 % (ref 0.0–0.2)

## 2023-10-25 LAB — HEMOGLOBIN A1C
Hgb A1c MFr Bld: 5.5 % (ref 4.8–5.6)
Mean Plasma Glucose: 111.15 mg/dL

## 2023-10-25 MED ORDER — SODIUM CHLORIDE 0.9 % IV BOLUS
1000.0000 mL | Freq: Once | INTRAVENOUS | Status: AC
  Administered 2023-10-25: 1000 mL via INTRAVENOUS

## 2023-10-25 NOTE — Discharge Instructions (Addendum)
 Your salt level was 130, your chest x-ray was normal, please salt your food this week and follow-up with your doctor.  ER for worsening symptoms, you have nothing to be concerned about today  You should do fine - 1 week recheck on your blood work

## 2023-10-25 NOTE — Progress Notes (Signed)
 Dr Ernesta Heading and Dr Margrette Shield notified of Sodium 126.  No orders given.

## 2023-10-25 NOTE — ED Provider Notes (Signed)
 [place] EMERGENCY DEPARTMENT AT [facility] Provider Note   CSN: [id number] Arrival date & time: 10/25/23  1845     History  Chief Complaint  Patient presents with   Low Sodium    Nathan Sanchez is a 63 y.o. male.  HPI   ` This patient is a very pleasant 63 year old male, he has a history of hypertension on lisinopril hydrochlorothiazide, he has been on Ozempic, Lyrica and takes glimepiride as well as buspirone baclofen and allopurinol.  He presents to the hospital today with a complaint of "abnormal labs".  The patient states he has been slightly more jumpy lately with some myoclonic jerking although he states he has some at baseline.  He is a preop evaluation with lab work for a carpal tunnel surgery and the lab work showed that his sodium was 126.  The surgery was canceled and it was recommended that he come to get evaluated.  The patient does not have any nausea vomiting diarrhea chest pain or shortness of breath.  He does not smoke cigarettes.    Home Medications Prior to Admission medications   Medication Sig Start Date End Date Taking? Authorizing Provider  allopurinol (ZYLOPRIM) 300 MG tablet Take 150-300 mg by mouth daily. Take 150mg  (1/2 tablet) daily for 7 days, then increase to 300mg  (1 tablet) once daily. 10/19/23   [provider]  baclofen (LIORESAL) 20 MG tablet Take 20 mg by mouth 3 (three) times daily as needed for muscle spasms. 09/29/21   [provider]  busPIRone (BUSPAR) 15 MG tablet Take 15 mg by mouth 3 (three) times daily.    [provider]  glimepiride (AMARYL) 4 MG tablet Take 4 mg by mouth daily. 09/27/21   [provider]  hydrocortisone 2.5 % cream Apply 1 Application topically 2 (two) times daily as needed (Irritation).    [provider]  lisinopril-hydrochlorothiazide (ZESTORETIC) 20-12.5 MG tablet Take 1 tablet by mouth daily.    [provider]  Multiple Vitamin (MULTIVITAMIN PO) Take 1  tablet by mouth daily.    [provider]  ondansetron  (ZOFRAN ) 8 MG tablet Take 8 mg by mouth 3 (three) times daily. 10/19/23   [provider]  Oxycodone  HCl 10 MG TABS Take 10 mg by mouth 4 (four) times daily. 09/29/21   [provider]  OZEMPIC, 1 MG/DOSE, 4 MG/3ML SOPN Inject 2 mg into the skin once a week. 09/04/23   [provider]  pregabalin (LYRICA) 75 MG capsule Take 75 mg by mouth 2 (two) times daily. 09/12/21   [provider]  rOPINIRole (REQUIP) 1 MG tablet Take 1 mg by mouth at bedtime. 09/20/21   [provider]  tadalafil (CIALIS) 5 MG tablet Take 5-20 mg by mouth daily as needed for erectile dysfunction.    [provider]  thiamine  (VITAMIN B1) 100 MG tablet Take 100 mg by mouth daily.    [provider]  zolpidem (AMBIEN) 10 MG tablet Take 10 mg by mouth at bedtime as needed for sleep.    [provider]      Allergies    Patient has no known allergies.    Review of Systems   Review of Systems  All other systems reviewed and are negative.   Physical Exam Updated Vital Signs BP (!) 150/80   Pulse 67   Temp (!) 97.4 F (36.3 C)   Resp 18   Ht 1.88 m (6\' 2" )   Wt 99.8 kg  SpO2 97%   BMI 28.25 kg/m  Physical Exam Vitals and nursing note reviewed.  Constitutional:      General: He is not in acute distress.    Appearance: He is well-developed.  HENT:     Head: Normocephalic and atraumatic.     Mouth/Throat:     Pharynx: No oropharyngeal exudate.  Eyes:     General: No scleral icterus.       Right eye: No discharge.        Left eye: No discharge.     Conjunctiva/sclera: Conjunctivae normal.     Pupils: Pupils are equal, round, and reactive to light.  Neck:     Thyroid: No thyromegaly.     Vascular: No JVD.  Cardiovascular:     Rate and Rhythm: Normal rate and regular rhythm.     Heart sounds: Normal heart sounds. No murmur heard.    No friction rub. No gallop.  Pulmonary:      Effort: Pulmonary effort is normal. No respiratory distress.     Breath sounds: Normal breath sounds. No wheezing or rales.  Abdominal:     General: Bowel sounds are normal. There is no distension.     Palpations: Abdomen is soft. There is no mass.     Tenderness: There is no abdominal tenderness.  Musculoskeletal:        General: No tenderness. Normal range of motion.     Cervical back: Normal range of motion and neck supple.     Right lower leg: No edema.     Left lower leg: No edema.  Lymphadenopathy:     Cervical: No cervical adenopathy.  Skin:    General: Skin is warm and dry.     Findings: No erythema or rash.  Neurological:     Mental Status: He is alert.     Coordination: Coordination normal.  Psychiatric:        Behavior: Behavior normal.     ED Results / Procedures / Treatments   Labs (all labs ordered are listed, but only abnormal results are displayed) Labs Reviewed  BASIC METABOLIC PANEL WITH GFR - Abnormal; Notable for the following components:      Result Value   Sodium 130 (*)    Chloride 92 (*)    Glucose, Bld 106 (*)    Creatinine, Ser 1.26 (*)    All other components within normal limits  CBC    EKG None  Radiology DG Chest Port 1 View Result Date: 10/25/2023 CLINICAL DATA:  hyponatremia - r/o mass EXAM: PORTABLE CHEST 1 VIEW COMPARISON:  10/10/2021 FINDINGS: Heart and mediastinal contours are within normal limits. No focal opacities or effusions. No acute bony abnormality. IMPRESSION: No active disease. Electronically Signed   By: Janeece Mechanic M.D.   On: 10/25/2023 20:32    Procedures Procedures    Medications Ordered in ED Medications  sodium chloride  0.9 % bolus 1,000 mL (1,000 mLs Intravenous New Bag/Given 10/25/23 1951)    ED Course/ Medical Decision Making/ A&P                                 Medical Decision Making Amount and/or Complexity of Data Reviewed Labs: ordered. Radiology: ordered.   The patient's exam is totally  unremarkable, his vital signs are reassuring, mild hypertension but no tachycardia.  He has no myoclonic jerking, normal neurologic exam, he has had a decreased appetite recently but was able  to eat today, he has been working outside all day with only eating a few eggs this morning.  I have talked to him about staying hydrated and having good nutrition.  Will recheck sodium, chest x-ray to make sure there is no signs of tumor, patient agreeable to the plan, IV fluids will be given  Na 130 CXR without findings VS without significant findings - mild htn Pt updated and agreeable.        Final Clinical Impression(s) / ED Diagnoses Final diagnoses:  Hyponatremia    Rx / DC Orders ED Discharge Orders     None         Early Glisson, MD 10/25/23 2052

## 2023-10-25 NOTE — Telephone Encounter (Signed)
 Hey [name] - Nathan Sanchez is scheduled for carpal tunnel on Monday. His sodium was 126 today, so he needs to see his PCP. Can't go ahead with surgery with his sodium that low      Spoke w/ Adell Hones and let her know pt sodium is too low to proceed with surgery. Let her know pt will need to get this regulated by PCP before we can proceed w/ surgery. She verbalized understanding and will be getting pt scheduled wit PCP as soon as possible.

## 2023-10-25 NOTE — ED Triage Notes (Signed)
 Pov from home. Low sodium with preop labs. 126. Feels ok just a slight headache. Decreased appetite

## 2023-10-29 ENCOUNTER — Encounter (HOSPITAL_COMMUNITY): Payer: Self-pay | Admitting: Orthopedic Surgery

## 2023-10-29 ENCOUNTER — Other Ambulatory Visit: Payer: Self-pay

## 2023-10-29 ENCOUNTER — Ambulatory Visit (HOSPITAL_COMMUNITY)
Admission: RE | Admit: 2023-10-29 | Discharge: 2023-10-29 | Disposition: A | Discharge status: Home / Self Care | Attending: Orthopedic Surgery | Admitting: Orthopedic Surgery

## 2023-10-29 ENCOUNTER — Ambulatory Visit (HOSPITAL_BASED_OUTPATIENT_CLINIC_OR_DEPARTMENT_OTHER): Admitting: Certified Registered Nurse Anesthetist

## 2023-10-29 ENCOUNTER — Encounter (HOSPITAL_COMMUNITY)
Admission: RE | Disposition: A | Discharge status: Home / Self Care | Payer: Self-pay | Source: Home / Self Care | Attending: Orthopedic Surgery

## 2023-10-29 ENCOUNTER — Ambulatory Visit (HOSPITAL_COMMUNITY): Admitting: Certified Registered Nurse Anesthetist

## 2023-10-29 DIAGNOSIS — Z79899 Other long term (current) drug therapy: Secondary | ICD-10-CM | POA: Insufficient documentation

## 2023-10-29 DIAGNOSIS — Z7984 Long term (current) use of oral hypoglycemic drugs: Secondary | ICD-10-CM | POA: Diagnosis not present

## 2023-10-29 DIAGNOSIS — G5602 Carpal tunnel syndrome, left upper limb: Secondary | ICD-10-CM | POA: Diagnosis present

## 2023-10-29 DIAGNOSIS — F419 Anxiety disorder, unspecified: Secondary | ICD-10-CM | POA: Diagnosis not present

## 2023-10-29 DIAGNOSIS — E1141 Type 2 diabetes mellitus with diabetic mononeuropathy: Secondary | ICD-10-CM | POA: Insufficient documentation

## 2023-10-29 DIAGNOSIS — I10 Essential (primary) hypertension: Secondary | ICD-10-CM | POA: Insufficient documentation

## 2023-10-29 HISTORY — PX: CARPAL TUNNEL RELEASE: SHX101

## 2023-10-29 LAB — GLUCOSE, CAPILLARY: Glucose-Capillary: 131 mg/dL — ABNORMAL HIGH (ref 70–99)

## 2023-10-29 SURGERY — CARPAL TUNNEL RELEASE
Anesthesia: General | Site: Hand | Laterality: Left

## 2023-10-29 MED ORDER — LACTATED RINGERS IV SOLN: INTRAVENOUS | Status: DC

## 2023-10-29 MED ORDER — DEXMEDETOMIDINE HCL IN NACL 80 MCG/20ML IV SOLN
INTRAVENOUS | Status: DC | PRN
  Administered 2023-10-29: 8 ug via INTRAVENOUS
  Administered 2023-10-29: 12 ug via INTRAVENOUS

## 2023-10-29 MED ORDER — 0.9 % SODIUM CHLORIDE (POUR BTL) OPTIME
TOPICAL | Status: DC | PRN
  Administered 2023-10-29: 1000 mL

## 2023-10-29 MED ORDER — FENTANYL CITRATE (PF) 100 MCG/2ML IJ SOLN
INTRAMUSCULAR | Status: AC
  Filled 2023-10-29: qty 2

## 2023-10-29 MED ORDER — ONDANSETRON HCL 4 MG PO TABS: 4.0000 mg | ORAL_TABLET | Freq: Three times a day (TID) | ORAL | 0 refills | Status: AC | PRN

## 2023-10-29 MED ORDER — PROPOFOL 10 MG/ML IV BOLUS
INTRAVENOUS | Status: AC
  Filled 2023-10-29: qty 20

## 2023-10-29 MED ORDER — BUPIVACAINE HCL (PF) 0.5 % IJ SOLN
INTRAMUSCULAR | Status: AC
  Filled 2023-10-29: qty 30

## 2023-10-29 MED ORDER — LIDOCAINE 2% (20 MG/ML) 5 ML SYRINGE
INTRAMUSCULAR | Status: DC | PRN
  Administered 2023-10-29: 60 mg via INTRAVENOUS

## 2023-10-29 MED ORDER — MIDAZOLAM HCL 2 MG/2ML IJ SOLN
INTRAMUSCULAR | Status: AC
  Filled 2023-10-29: qty 2

## 2023-10-29 MED ORDER — LACTATED RINGERS IV SOLN: INTRAVENOUS | Status: DC | PRN

## 2023-10-29 MED ORDER — PROPOFOL 500 MG/50ML IV EMUL
INTRAVENOUS | Status: DC | PRN
  Administered 2023-10-29: 167 mg via INTRAVENOUS
  Administered 2023-10-29: 50 mg via INTRAVENOUS
  Administered 2023-10-29: 100 mg via INTRAVENOUS
  Administered 2023-10-29: 125 ug/kg/min via INTRAVENOUS

## 2023-10-29 MED ORDER — FENTANYL CITRATE (PF) 100 MCG/2ML IJ SOLN
INTRAMUSCULAR | Status: DC | PRN
  Administered 2023-10-29 (×2): 25 ug via INTRAVENOUS
  Administered 2023-10-29: 50 ug via INTRAVENOUS
  Administered 2023-10-29: 25 ug via INTRAVENOUS
  Administered 2023-10-29: 50 ug via INTRAVENOUS
  Administered 2023-10-29: 25 ug via INTRAVENOUS

## 2023-10-29 MED ORDER — MIDAZOLAM HCL 2 MG/2ML IJ SOLN
INTRAMUSCULAR | Status: DC | PRN
  Administered 2023-10-29: 2 mg via INTRAVENOUS

## 2023-10-29 MED ORDER — CEFAZOLIN SODIUM-DEXTROSE 2-4 GM/100ML-% IV SOLN
2.0000 g | INTRAVENOUS | Status: AC
  Administered 2023-10-29: 2 g via INTRAVENOUS
  Filled 2023-10-29: qty 100

## 2023-10-29 MED ORDER — DEXMEDETOMIDINE HCL IN NACL 80 MCG/20ML IV SOLN
INTRAVENOUS | Status: AC
  Filled 2023-10-29: qty 20

## 2023-10-29 MED ORDER — ONDANSETRON HCL 4 MG/2ML IJ SOLN
INTRAMUSCULAR | Status: AC
  Filled 2023-10-29: qty 2

## 2023-10-29 MED ORDER — ORAL CARE MOUTH RINSE: 15.0000 mL | Freq: Once | OROMUCOSAL | Status: AC

## 2023-10-29 MED ORDER — CHLORHEXIDINE GLUCONATE 0.12 % MT SOLN
15.0000 mL | Freq: Once | OROMUCOSAL | Status: AC
  Administered 2023-10-29: 15 mL via OROMUCOSAL

## 2023-10-29 MED ORDER — ONDANSETRON HCL 4 MG/2ML IJ SOLN
INTRAMUSCULAR | Status: DC | PRN
  Administered 2023-10-29: 4 mg via INTRAVENOUS

## 2023-10-29 MED ORDER — BUPIVACAINE HCL (PF) 0.5 % IJ SOLN
INTRAMUSCULAR | Status: DC | PRN
  Administered 2023-10-29: 10 mL

## 2023-10-29 SURGICAL SUPPLY — 31 items
BANDAGE ESMARK 4X12 BL STRL LF (DISPOSABLE) ×1 IMPLANT
BLADE SURG 15 STRL LF DISP TIS (BLADE) ×1 IMPLANT
BNDG COHESIVE 2X5 TAN ST LF (GAUZE/BANDAGES/DRESSINGS) ×1 IMPLANT
BNDG ELASTIC 3X5.8 VLCR NS LF (GAUZE/BANDAGES/DRESSINGS) ×1 IMPLANT
BNDG GAUZE DERMACEA FLUFF 4 (GAUZE/BANDAGES/DRESSINGS) IMPLANT
CHLORAPREP W/TINT 26 (MISCELLANEOUS) ×1 IMPLANT
CLOTH BEACON ORANGE TIMEOUT ST (SAFETY) ×1 IMPLANT
CORD BIPOLAR FORCEPS 12FT (ELECTRODE) ×1 IMPLANT
COVER LIGHT HANDLE STERIS (MISCELLANEOUS) ×2 IMPLANT
CUFF TOURN SGL QUICK 18X4 (TOURNIQUET CUFF) ×1 IMPLANT
DRAPE HALF SHEET 40X57 (DRAPES) ×1 IMPLANT
GAUZE SPONGE 4X4 12PLY STRL (GAUZE/BANDAGES/DRESSINGS) ×1 IMPLANT
GAUZE XEROFORM 1X8 LF (GAUZE/BANDAGES/DRESSINGS) ×1 IMPLANT
GLOVE BIO SURGEON STRL SZ8 (GLOVE) ×3 IMPLANT
GLOVE BIOGEL PI IND STRL 7.0 (GLOVE) ×2 IMPLANT
GLOVE BIOGEL PI IND STRL 8 (GLOVE) ×1 IMPLANT
GOWN STRL REUS W/TWL LRG LVL3 (GOWN DISPOSABLE) ×1 IMPLANT
GOWN STRL REUS W/TWL XL LVL3 (GOWN DISPOSABLE) ×1 IMPLANT
KIT TURNOVER KIT A (KITS) ×1 IMPLANT
MANIFOLD NEPTUNE II (INSTRUMENTS) ×1 IMPLANT
NDL HYPO 21X1.5 SAFETY (NEEDLE) ×1 IMPLANT
NEEDLE HYPO 21X1.5 SAFETY (NEEDLE) ×1 IMPLANT
NS IRRIG 1000ML POUR BTL (IV SOLUTION) ×1 IMPLANT
PACK BASIC LIMB (CUSTOM PROCEDURE TRAY) ×1 IMPLANT
PAD ARMBOARD POSITIONER FOAM (MISCELLANEOUS) ×1 IMPLANT
POSITIONER HAND ALUMI XLG (MISCELLANEOUS) ×1 IMPLANT
POSITIONER HEAD 8X9X4 ADT (SOFTGOODS) ×1 IMPLANT
SET BASIN LINEN APH (SET/KITS/TRAYS/PACK) ×1 IMPLANT
SUT PROLENE NAB BLUE 3-0 30IN (SUTURE) IMPLANT
SYR CONTROL 10ML LL (SYRINGE) ×1 IMPLANT
UNDERPAD 30X36 HEAVY ABSORB (UNDERPADS AND DIAPERS) ×1 IMPLANT

## 2023-10-29 NOTE — Anesthesia Procedure Notes (Signed)
 Procedure Name: LMA Insertion Date/Time: 10/29/2023 7:43 AM  Performed by: Alex Hylan, CRNAPre-anesthesia Checklist: Emergency Drugs available, Patient identified, Suction available and Patient being monitored Patient Re-evaluated:Patient Re-evaluated prior to induction Oxygen Delivery Method: Circle system utilized Preoxygenation: Pre-oxygenation with 100% oxygen Induction Type: IV induction LMA: LMA inserted LMA Size: 4.0 Number of attempts: 1 Placement Confirmation: positive ETCO2 and breath sounds checked- equal and bilateral Tube secured with: Tape Dental Injury: Teeth and Oropharynx as per pre-operative assessment

## 2023-10-29 NOTE — Interval H&P Note (Signed)
 History and Physical Interval Note:  10/29/2023 7:22 AM  Nathan Sanchez  has presented today for surgery, with the diagnosis of Left carpal tunnel syndrome.  The various methods of treatment have been discussed with the patient and family. After consideration of risks, benefits and other options for treatment, the patient has consented to  Procedure(s): CARPAL TUNNEL RELEASE (Left) as a surgical intervention.  The patient's history has been reviewed, patient examined, no change in status, stable for surgery.  I have reviewed the patient's chart and labs.  Questions were answered to the patient's satisfaction.     Tonita Frater

## 2023-10-29 NOTE — Progress Notes (Signed)
 Dr.Kiel notified of patient's sodium 130. No new orders given.

## 2023-10-29 NOTE — Discharge Instructions (Addendum)
  Nathan A. Dallas Schimke, MD [facility] [address] [place],  [place]  [address] Phone: [phone] Fax: [phone]    POST-OPERATIVE INSTRUCTIONS   WOUND CARE You may remove your bandage on postop day 3 and get the hand wet.  No ointments or lotions to be applied to the incision.  Do not submerge the incision for 1 month.  FOLLOW-UP If you develop a Fever (>101.5), Redness or Drainage from the surgical incision site, please call our office to arrange for an evaluation. Please call the office to schedule a follow-up appointment for your incision check if you do not already have one, 7-10 days post-operatively.  IF YOU HAVE ANY QUESTIONS, PLEASE FEEL FREE TO CALL OUR OFFICE.  HELPFUL INFORMATION  You should wean off your narcotic medicines as soon as you are able.  Most patients will be off or using minimal narcotics before their first postop appointment.   You may be more comfortable sleeping in a semi-seated position the first few nights following surgery.  Keep a pillow propped under the elbow and forearm for comfort.  If you have a recliner type of chair it might be beneficial.    We suggest you use the pain medication the first night prior to going to bed, in order to ease any pain when the anesthesia wears off. You should avoid taking pain medications on an empty stomach as it will make you nauseous.  Do not drink alcoholic beverages or take illicit drugs when taking pain medications.  You may return to work/school in the next couple of days when you feel up to it. Desk work and typing is fine.  Pain medication may make you constipated.  Below are a few solutions to try in this order: Decrease the amount of pain medication if you aren't having pain. Drink lots of decaffeinated fluids. Drink prune juice and/or each dried prunes  If the first 3 don't work start with additional solutions Take Colace - an over-the-counter stool softener Take  Senokot - an over-the-counter laxative Take Miralax - a stronger over-the-counter laxative   Post Anesthesia Home Care Instructions  Activity: Get plenty of rest for the remainder of the day. A responsible individual must stay with you for 24 hours following the procedure.  For the next 24 hours, DO NOT: -Drive a car -Advertising copywriter -Drink alcoholic beverages -Take any medication unless instructed by your physician -Make any legal decisions or sign important papers.  Meals: Start with liquid foods such as gelatin or soup. Progress to regular foods as tolerated. Avoid greasy, spicy, heavy foods. If nausea and/or vomiting occur, drink only clear liquids until the nausea and/or vomiting subsides. Call your physician if vomiting continues.  Special Instructions/Symptoms: Your throat may feel dry or sore from the anesthesia or the breathing tube placed in your throat during surgery. If this causes discomfort, gargle with warm salt water. The discomfort should disappear within 24 hours.

## 2023-10-29 NOTE — Anesthesia Preprocedure Evaluation (Signed)
 Anesthesia Evaluation  Patient identified by MRN, date of birth, ID band Patient awake    Reviewed: Allergy & Precautions, H&P , NPO status , Patient's Chart, lab work & pertinent test results, reviewed documented beta blocker date and time   Airway Mallampati: II  TM Distance: >3 FB Neck ROM: full    Dental no notable dental hx.    Pulmonary neg pulmonary ROS   Pulmonary exam normal breath sounds clear to auscultation       Cardiovascular Exercise Tolerance: Good hypertension, negative cardio ROS  Rhythm:regular Rate:Normal     Neuro/Psych negative neurological ROS  negative psych ROS   GI/Hepatic negative GI ROS, Neg liver ROS,,,  Endo/Other  negative endocrine ROSdiabetes    Renal/GU negative Renal ROS  negative genitourinary   Musculoskeletal   Abdominal   Peds  Hematology negative hematology ROS (+)   Anesthesia Other Findings   Reproductive/Obstetrics negative OB ROS                             Anesthesia Physical Anesthesia Plan  ASA: 2  Anesthesia Plan: General   Post-op Pain Management:    Induction:   PONV Risk Score and Plan: Propofol infusion  Airway Management Planned:   Additional Equipment:   Intra-op Plan:   Post-operative Plan:   Informed Consent: I have reviewed the patients History and Physical, chart, labs and discussed the procedure including the risks, benefits and alternatives for the proposed anesthesia with the patient or authorized representative who has indicated his/her understanding and acceptance.     Dental Advisory Given  Plan Discussed with: CRNA  Anesthesia Plan Comments:        Anesthesia Quick Evaluation

## 2023-10-29 NOTE — Transfer of Care (Signed)
 Immediate Anesthesia Transfer of Care Note  Patient: Nathan Sanchez  Procedure(s) Performed: CARPAL TUNNEL RELEASE (Left: Hand)  Patient Location: PACU  Anesthesia Type:General  Level of Consciousness: drowsy  Airway & Oxygen Therapy: Patient Spontanous Breathing and Patient connected to nasal cannula oxygen  Post-op Assessment: Report given to RN and Post -op Vital signs reviewed and stable  Post vital signs: Reviewed and stable  Last Vitals:  Vitals Value Taken Time  BP 111/62   Temp 98.8   Pulse 56 10/29/23 0821  Resp 20 10/29/23 0821  SpO2 91 % 10/29/23 0821  Vitals shown include unfiled device data.  Last Pain:  Vitals:   10/29/23 0635  TempSrc: Oral  PainSc: 0-No pain         Complications: No notable events documented.

## 2023-10-29 NOTE — Op Note (Signed)
 Orthopaedic Surgery Operative Note (CSN: [id number])  Nathan Sanchez  22-Aug-1960 Date of Surgery: 10/29/2023   Diagnoses:  Left carpal tunnel syndrome  Procedure: Left Open Carpal Tunnel Release   Operative Finding Successful completion of the planned procedure.    Post-Op Diagnosis: Same Surgeons:Primary: Tonita Frater, MD Assistants: None Location: [facility] OR ROOM 4 Anesthesia: General with local anesthesia Antibiotics: Ancef  2 g Tourniquet time:  Total Tourniquet Time Documented: Upper Arm (Left) - 25 minutes Total: Upper Arm (Left) - 25 minutes  Estimated Blood Loss: 5 cc Complications: None Specimens: None  Implants: None  Indications for Surgery:   Nathan Sanchez is a 63 y.o. male with symptoms consistent with carpal tunnel syndrome.  Symptoms have been ongoing, and progressively worsening.  They have tried medications and bracing without improvement in symptoms.  EMG results demonstrate severe left carpal tunnel syndrome.  Risks and benefits of operative and nonoperative management were discussed prior to surgery with the patient and informed consent form was completed.  Specific risks including infection, need for additional surgery, bleeding, recurrent symptoms, incomplete resolution of symptoms, persistent pain and more severe complications associated with anesthesia were discussed.  All questions were answered.  Surgical consent was finalized.    Procedure:   The patient was identified properly. Informed consent was finalized and the surgical site was marked. The patient was taken to the OR where general anesthesia was induced.  The patient was positioned supine, on a hand table.  The left arm was prepped and draped in the usual sterile fashion.  Timeout was performed before the beginning of the case.  Tourniquet was used for the above duration.  Antibiotics were administered prior to making incision.  Incision was made in line with the radial border of the ring finger.  The carpal tunnel transverse fascia was identified, cleaned, and incised sharply. The common sensory branches were visualized along with the superficial palmar arch and protected.  The median nerve was protected below. Deep retractors were placed underneath the transverse carpal ligament, protecting the nerve. I released the ligament completely, and then released the distal volar forearm fascia. The nerve was identified, and visualized, and protected throughout the case. No masses or abnormalities were identified in ulnar bursa.  The wounds were irrigated copiously, and the wounds injected. Skin closed with interrupted sutures followed by a bulky dressing. Patient  tolerated this well, with no complications.   Post-operative plan:  The patient will be discharged home from the PACU. WBAT on the operative extremity; limit lifting to nothing more than a coffee cup until follow up appointment   DVT prophylaxis not indicated in this ambulatory upper extremity patient without significant risk factors.    Pain control with PRN pain medication preferring oral medicines.   Follow up plan will be scheduled in approximately 7-10 days for incision check

## 2023-10-30 ENCOUNTER — Encounter (HOSPITAL_COMMUNITY): Payer: Self-pay | Admitting: Orthopedic Surgery

## 2023-11-02 NOTE — Anesthesia Postprocedure Evaluation (Signed)
 Anesthesia Post Note  Patient: Nathan Sanchez  Procedure(s) Performed: CARPAL TUNNEL RELEASE (Left: Hand)  Patient location during evaluation: Phase II Anesthesia Type: General Level of consciousness: awake Pain management: pain level controlled Vital Signs Assessment: post-procedure vital signs reviewed and stable Respiratory status: spontaneous breathing and respiratory function stable Cardiovascular status: blood pressure returned to baseline and stable Postop Assessment: no headache and no apparent nausea or vomiting Anesthetic complications: no Comments: Late entry   No notable events documented.   Last Vitals:  Vitals:   10/29/23 0915 10/29/23 0920  BP: 139/74 (!) 146/75  Pulse: (!) 53 64  Resp: (!) 9 16  Temp:  36.5 C  SpO2: 94% 97%    Last Pain:  Vitals:   11/01/23 1427  TempSrc:   PainSc: 0-No pain                 Coretha Dew

## 2023-11-07 ENCOUNTER — Ambulatory Visit (INDEPENDENT_AMBULATORY_CARE_PROVIDER_SITE_OTHER): Admitting: Orthopedic Surgery

## 2023-11-07 ENCOUNTER — Encounter: Payer: Self-pay | Admitting: Orthopedic Surgery

## 2023-11-07 DIAGNOSIS — G5602 Carpal tunnel syndrome, left upper limb: Secondary | ICD-10-CM

## 2023-11-07 NOTE — Progress Notes (Signed)
 Orthopaedic Postop Note  Assessment: Nathan Sanchez is a 63 y.o. male s/p left open carpal tunnel release  DOS: 10/29/2023  Plan: Nathan Sanchez has done well.  Still has some residual numbness Denies burning or shooting pains.  Some persistent tingling sensations in the median nerve distribution.  Surgical incision is healing well.  Sutures were removed, and Steri-Strips were placed.  Okay to return to work.  Keep incision covered.  Do not submerge the wound.  Return in 4 weeks.    Follow-up: No follow-ups on file. XR at next visit: None  Subjective:  Chief Complaint  Patient presents with   Post-op Follow-up    History of Present Illness: Nathan Sanchez is a 63 y.o. male who presents following the above stated procedure.  Surgery was approximately 2 weeks ago.  No issues.  Some pain for the first few days.  Burning and shooting pains have almost completely resolved.  Some numbness in the fingers.   No issues with the incision.  He has returned to his previous level of activities already.  Review of Systems: No fevers or chills Some numbness or tingling No Chest Pain No shortness of breath   Objective: There were no vitals taken for this visit.  Physical Exam:  Alert and oriented, no acute distress.  Surgical incision is healing well.  No surrounding erythema or drainage.  Mild tenderness to palpation about surgical site.  Sensation is intact in the median nerve distribution.  Able to make a full fist. No redness. 2+ radial pulse.  Small amount of blood is visible.  Incision is not actively bleeding  IMAGING: I personally ordered and reviewed the following images:  No new imaging obtained today  Nathan Frater, MD 11/07/2023 3:38 PM

## 2023-12-05 ENCOUNTER — Encounter: Admitting: Orthopedic Surgery

## 2024-02-06 ENCOUNTER — Encounter (HOSPITAL_BASED_OUTPATIENT_CLINIC_OR_DEPARTMENT_OTHER): Attending: General Surgery | Admitting: General Surgery

## 2024-02-06 DIAGNOSIS — L97522 Non-pressure chronic ulcer of other part of left foot with fat layer exposed: Secondary | ICD-10-CM | POA: Diagnosis not present

## 2024-02-06 DIAGNOSIS — L97512 Non-pressure chronic ulcer of other part of right foot with fat layer exposed: Secondary | ICD-10-CM | POA: Diagnosis present

## 2024-02-06 DIAGNOSIS — E11621 Type 2 diabetes mellitus with foot ulcer: Secondary | ICD-10-CM | POA: Diagnosis not present

## 2024-02-13 ENCOUNTER — Encounter (HOSPITAL_BASED_OUTPATIENT_CLINIC_OR_DEPARTMENT_OTHER): Admitting: General Surgery

## 2024-02-13 DIAGNOSIS — L97512 Non-pressure chronic ulcer of other part of right foot with fat layer exposed: Secondary | ICD-10-CM | POA: Diagnosis not present

## 2024-02-14 ENCOUNTER — Encounter (HOSPITAL_COMMUNITY): Payer: Self-pay

## 2024-02-14 ENCOUNTER — Emergency Department (HOSPITAL_COMMUNITY)
Admission: EM | Admit: 2024-02-14 | Discharge: 2024-02-15 | Discharge status: Against Medical Advice | Attending: Emergency Medicine | Admitting: Emergency Medicine

## 2024-02-14 ENCOUNTER — Other Ambulatory Visit: Payer: Self-pay

## 2024-02-14 DIAGNOSIS — Z5321 Procedure and treatment not carried out due to patient leaving prior to being seen by health care provider: Secondary | ICD-10-CM | POA: Diagnosis not present

## 2024-02-14 DIAGNOSIS — M25551 Pain in right hip: Secondary | ICD-10-CM | POA: Insufficient documentation

## 2024-02-14 NOTE — ED Triage Notes (Signed)
 Pov from home. Ambulatory to tx room. Was eating snacks in lobby. Cc of right hip pain x1 week. Tried to see pcp yesterday but they were unavailable. 8/10. Denies any injury. Did not take anything for pain.

## 2024-02-15 NOTE — ED Notes (Addendum)
 Pt's family ambulated to hallway stating he is hurting so bad. If the doctor isn't coming in the next few minutes we're leaving.

## 2024-02-20 ENCOUNTER — Encounter (HOSPITAL_BASED_OUTPATIENT_CLINIC_OR_DEPARTMENT_OTHER): Attending: General Surgery | Admitting: General Surgery

## 2024-02-20 DIAGNOSIS — E11621 Type 2 diabetes mellitus with foot ulcer: Secondary | ICD-10-CM | POA: Insufficient documentation

## 2024-02-20 DIAGNOSIS — L97522 Non-pressure chronic ulcer of other part of left foot with fat layer exposed: Secondary | ICD-10-CM | POA: Diagnosis not present

## 2024-02-27 ENCOUNTER — Encounter (HOSPITAL_BASED_OUTPATIENT_CLINIC_OR_DEPARTMENT_OTHER): Admitting: General Surgery

## 2024-02-27 DIAGNOSIS — E11621 Type 2 diabetes mellitus with foot ulcer: Secondary | ICD-10-CM | POA: Diagnosis not present

## 2024-03-05 ENCOUNTER — Ambulatory Visit (HOSPITAL_BASED_OUTPATIENT_CLINIC_OR_DEPARTMENT_OTHER): Admitting: General Surgery

## 2024-03-12 ENCOUNTER — Encounter (HOSPITAL_BASED_OUTPATIENT_CLINIC_OR_DEPARTMENT_OTHER): Admitting: General Surgery

## 2024-03-12 ENCOUNTER — Ambulatory Visit: Admitting: Orthopedic Surgery

## 2024-03-12 ENCOUNTER — Encounter: Payer: Self-pay | Admitting: Orthopedic Surgery

## 2024-03-12 VITALS — BP 164/77 | HR 66 | Ht 74.0 in | Wt 203.0 lb

## 2024-03-12 DIAGNOSIS — M7061 Trochanteric bursitis, right hip: Secondary | ICD-10-CM | POA: Diagnosis not present

## 2024-03-12 DIAGNOSIS — E11621 Type 2 diabetes mellitus with foot ulcer: Secondary | ICD-10-CM | POA: Diagnosis not present

## 2024-03-12 NOTE — Patient Instructions (Signed)

## 2024-03-12 NOTE — Progress Notes (Signed)
 Orthopaedic Clinic Return  Assessment: Nathan Sanchez is a 63 y.o. male with the following: Right greater trochanteric bursitis  Plan: Mr. Farias has pain in the right lateral hip, radiating distally.  He is tender over the greater trochanter laterally.  He does not have pain in the groin in the impingement position.  He does report that he had some pain in the lower back and into the buttock, but this is now radiating into the anterior leg, past the knee.  Because of the tenderness, I would like to treat the greater trochanteric bursitis.  We will proceed with an injection today.  On clinical exam, he does have a slightly shorter right leg compared to the left.  Equalizing this with a shoe lift could be beneficial.  If he continues to have issues, we may have to consider further evaluation of the lumbar spine.  Procedure note injection - Right lateral hip   Verbal consent was obtained to inject the Right lateral hip.  Patient localized the pain. Timeout was completed to confirm the site of injection.  The skin was prepped with alcohol and ethyl chloride was sprayed at the injection site.  A 21-gauge needle was used to inject 40 mg of Depo-Medrol  and 1% lidocaine  (4 cc) into the Right lateral hip, directly over the localized tenderness using a direct lateral approach.  There were no complications. A sterile bandage was applied.   Follow-up: No follow-ups on file.   Subjective:  Chief Complaint  Patient presents with   Hip Pain    Pain started in back now in right side and pain runs down front of leg genies groin pain and pain in back of leg has tried voltaren gel, lidocaine  patches, ice and heat, stretching and massage. Pain for about a month at one point couldn't walk had to bend over. Was started on allpurinol daily about 4 months ago. He has stopped in 2 weeks ago.    History of Present Illness: Nathan Sanchez is a 63 y.o. male who returns to clinic for evaluation of right lateral hip  pain.  He said pain in the right hip area for the past several months.  No specific injury.  He states pain started in the buttock primarily, and has now started to wrap around the lateral aspect of the right hip and distal.  Occasionally, the pain will radiate past his knee.  He does have tenderness on the right side of the hip.  He has a history of multiple procedures on his lower leg, states he has a leg length discrepancy on the right.  Medications have not been effective.  He has tried some topical treatments, without sustained relief.  Review of Systems: No fevers or chills No numbness or tingling No chest pain No shortness of breath No bowel or bladder dysfunction No GI distress No headaches   Objective: BP (!) 164/77   Pulse 66   Ht 6' 2 (1.88 m)   Wt 203 lb (92.1 kg)   BMI 26.06 kg/m   Physical Exam:  Evaluation of the right hip demonstrates no deformity.  No redness.  No swelling.  He has tenderness to palpation over the greater trochanter laterally.  He has smooth and painless range of motion in the right hip, including internal and external rotation.  No increased discomfort in the impingement position.  Clinically, approximately 1 cm leg length discrepancy, with the right being shorter than the left.  IMAGING: I personally ordered and reviewed the following  images:        Nathan DELENA Horde, MD 03/12/2024 1:56 PM

## 2024-03-19 ENCOUNTER — Ambulatory Visit (INDEPENDENT_AMBULATORY_CARE_PROVIDER_SITE_OTHER): Admitting: Orthopedic Surgery

## 2024-03-19 ENCOUNTER — Other Ambulatory Visit: Payer: Self-pay

## 2024-03-19 DIAGNOSIS — M545 Low back pain, unspecified: Secondary | ICD-10-CM | POA: Diagnosis not present

## 2024-03-19 DIAGNOSIS — M7061 Trochanteric bursitis, right hip: Secondary | ICD-10-CM

## 2024-03-19 MED ORDER — PREDNISONE 10 MG (21) PO TBPK: ORAL_TABLET | ORAL | 0 refills | Status: AC

## 2024-03-20 ENCOUNTER — Encounter: Payer: Self-pay | Admitting: Orthopedic Surgery

## 2024-03-20 NOTE — Progress Notes (Signed)
 Orthopaedic Clinic Return  Assessment: Nathan Sanchez is a 63 y.o. male with the following: Right greater trochanteric bursitis  Plan: Mr. Roehrig continues to have pain around the right hip.  He is complaining of more pain radiating distally in the leg.  Injection over the lateral hip improved symptoms for a couple of days, but the pain has returned.  He has started to have some pain in the lower back, especially after positioning for x-rays today.  He does have a history of low back pain as well.  Radiographs demonstrates some degenerative changes, trace anterolisthesis.  It is to be combination of greater trochanteric bursitis and low back pain.  I recommended a short course of steroids.  Depending on the efficacy, we may consider obtaining an MRI.   Follow-up: Return in about 2 weeks (around 04/02/2024).   Subjective:  Chief Complaint  Patient presents with   Back Pain    Going down the R leg. Pt had injection in greater troch. 03/12/24 that only helped for 2 days and now the pain is back like before.     History of Present Illness: Nathan Sanchez is a 63 y.o. male who returns to clinic for evaluation of right hip pain.  I saw him in clinic last week, injected the greater trochanter on the right.  He states his symptoms improved for a couple of days, but the pain has returned.  He is now having a lot more pain radiating distally.  He does have a history of back pain.  He was not complaining of back pain until he was position for x-rays today.  Pain is radiating distal to the knee .   Review of Systems: No fevers or chills No numbness or tingling No chest pain No shortness of breath No bowel or bladder dysfunction No GI distress No headaches   Objective: There were no vitals taken for this visit.  Physical Exam:  Evaluation of the right hip demonstrates no deformity.  No redness.  No swelling.  He has tenderness to palpation over the greater trochanter laterally.  He has smooth  and painless range of motion in the right hip, including internal and external rotation.  No increased discomfort in the impingement position.  Clinically, approximately 1 cm leg length discrepancy, with the right being shorter than the left.  Negative straight leg raise.  No tenderness to palpation of the lower back.  IMAGING: I personally ordered and reviewed the following images:   Standing lumbar spine x-rays were obtained in clinic today.  Thoracolumbar scoliosis, with mild pelvic tilt.  There is trace anterolisthesis at L4-5.  Diffuse degenerative changes including anterior based osteophytes.  Mild loss of disc height.  No bony lesions.  Impression: Lumbar spine x-rays with thoracolumbar scoliosis, and diffuse degenerative changes.   Oneil DELENA Horde, MD 03/20/2024 9:05 AM

## 2024-03-26 ENCOUNTER — Encounter (HOSPITAL_BASED_OUTPATIENT_CLINIC_OR_DEPARTMENT_OTHER): Attending: General Surgery | Admitting: General Surgery

## 2024-03-26 DIAGNOSIS — L97512 Non-pressure chronic ulcer of other part of right foot with fat layer exposed: Secondary | ICD-10-CM | POA: Insufficient documentation

## 2024-03-26 DIAGNOSIS — E11621 Type 2 diabetes mellitus with foot ulcer: Secondary | ICD-10-CM | POA: Insufficient documentation

## 2024-03-26 DIAGNOSIS — L97522 Non-pressure chronic ulcer of other part of left foot with fat layer exposed: Secondary | ICD-10-CM | POA: Insufficient documentation

## 2024-03-26 DIAGNOSIS — L03031 Cellulitis of right toe: Secondary | ICD-10-CM | POA: Insufficient documentation

## 2024-03-26 DIAGNOSIS — L84 Corns and callosities: Secondary | ICD-10-CM | POA: Diagnosis not present

## 2024-04-01 ENCOUNTER — Encounter: Payer: Self-pay | Admitting: Orthopedic Surgery

## 2024-04-01 ENCOUNTER — Ambulatory Visit: Admitting: Orthopedic Surgery

## 2024-04-01 DIAGNOSIS — M545 Low back pain, unspecified: Secondary | ICD-10-CM | POA: Diagnosis not present

## 2024-04-01 NOTE — Progress Notes (Signed)
 Orthopaedic Clinic Return  Assessment: Nathan Sanchez is a 63 y.o. male with the following: Lumbar radiculopathy  Plan: Mr. Mcgue continues to have pain and discomfort in the right buttock, radiating distally.  Since I last saw him, he has seen Dr. Debby, a neurosurgeon in [place].  MRI has been authorized.  I advised the patient and his wife to push to get the MRI scheduled, as this would be my next recommendation.  Once the MRI is complete, he will follow-up with Dr. Debby.  If he has any questions in the interim, I am happy to discuss this over the phone, or see him again in clinic.  If there is nothing concerning on the MRI, we do have some potential treatment options, and I am happy to see him back.   Follow-up: Return if symptoms worsen or fail to improve.   Subjective:  Chief Complaint  Patient presents with   Back Pain    LBP from glut down the R knee. Feels like it's getting worse. Lyrica and steroid helped a little but pain is right back .     History of Present Illness: Nathan Sanchez is a 63 y.o. male who returns to clinic for evaluation of right hip pain.  I have seen him a couple times for these complaints.  He is currently complaining of pain in the right buttock, radiating distally.  It does go past the knee.  He has a history of neck surgery, and saw his neurosurgeon since I last saw him.  They have had an MRI authorized.  Prednisone provided at the last visit eased the pain a little bit.  However, he feels as though his pain is getting worse.  He states the Lyrica takes the edge off, and improves his pain.  He was not taking this consistently.   Review of Systems: No fevers or chills No numbness or tingling No chest pain No shortness of breath No bowel or bladder dysfunction No GI distress No headaches   Objective: There were no vitals taken for this visit.  Physical Exam:  Evaluation of the right hip demonstrates no deformity.  No redness.  No swelling.   He has mild tenderness to palpation over the greater trochanter laterally.  He has smooth and painless range of motion in the right hip, including internal and external rotation.  No increased discomfort in the impingement position.  Clinically, approximately 1 cm leg length discrepancy, with the right being shorter than the left.  Negative straight leg raise.  No tenderness to palpation of the lower back.  IMAGING: I personally ordered and reviewed the following images:   Standing lumbar spine x-rays were obtained in clinic today.  Thoracolumbar scoliosis, with mild pelvic tilt.  There is trace anterolisthesis at L4-5.  Diffuse degenerative changes including anterior based osteophytes.  Mild loss of disc height.  No bony lesions.  Impression: Lumbar spine x-rays with thoracolumbar scoliosis, and diffuse degenerative changes.   Oneil DELENA Horde, MD 04/01/2024 9:08 AM

## 2024-04-02 ENCOUNTER — Ambulatory Visit: Admitting: Podiatry

## 2024-04-02 DIAGNOSIS — L97512 Non-pressure chronic ulcer of other part of right foot with fat layer exposed: Secondary | ICD-10-CM

## 2024-04-02 MED ORDER — AMOXICILLIN-POT CLAVULANATE 875-125 MG PO TABS: 1.0000 | ORAL_TABLET | Freq: Two times a day (BID) | ORAL | 0 refills | Status: AC
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
## 2024-04-09 ENCOUNTER — Encounter (HOSPITAL_BASED_OUTPATIENT_CLINIC_OR_DEPARTMENT_OTHER): Admitting: General Surgery

## 2024-04-09 DIAGNOSIS — E11621 Type 2 diabetes mellitus with foot ulcer: Secondary | ICD-10-CM | POA: Diagnosis not present

## 2024-04-11 ENCOUNTER — Other Ambulatory Visit (HOSPITAL_COMMUNITY): Payer: Self-pay | Admitting: General Surgery

## 2024-04-11 ENCOUNTER — Ambulatory Visit (HOSPITAL_COMMUNITY)
Admission: RE | Admit: 2024-04-11 | Discharge: 2024-04-11 | Disposition: A | Discharge status: Home / Self Care | Source: Ambulatory Visit | Attending: General Surgery | Admitting: General Surgery

## 2024-04-11 DIAGNOSIS — E13621 Other specified diabetes mellitus with foot ulcer: Secondary | ICD-10-CM | POA: Diagnosis present

## 2024-04-11 DIAGNOSIS — L97509 Non-pressure chronic ulcer of other part of unspecified foot with unspecified severity: Secondary | ICD-10-CM | POA: Insufficient documentation

## 2024-04-16 ENCOUNTER — Encounter (HOSPITAL_BASED_OUTPATIENT_CLINIC_OR_DEPARTMENT_OTHER): Admitting: General Surgery
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
## 2024-04-21 ENCOUNTER — Telehealth (HOSPITAL_COMMUNITY): Payer: Self-pay

## 2024-04-21 ENCOUNTER — Ambulatory Visit (HOSPITAL_COMMUNITY)

## 2024-04-21 NOTE — Telephone Encounter (Signed)
 Called patient concerning missed PT evaluation. Patient reports he is receiving PT services somewhere else due to the long wait to get into this clinic.   3:10 PM, 04/21/24 Rosaria Settler, PT, DPT  Rehabilitation - [place]

## 2024-04-23 ENCOUNTER — Encounter (HOSPITAL_BASED_OUTPATIENT_CLINIC_OR_DEPARTMENT_OTHER): Attending: General Surgery | Admitting: General Surgery

## 2024-04-23 DIAGNOSIS — L03031 Cellulitis of right toe: Secondary | ICD-10-CM | POA: Insufficient documentation

## 2024-04-23 DIAGNOSIS — E11621 Type 2 diabetes mellitus with foot ulcer: Secondary | ICD-10-CM | POA: Insufficient documentation

## 2024-04-23 DIAGNOSIS — L97522 Non-pressure chronic ulcer of other part of left foot with fat layer exposed: Secondary | ICD-10-CM | POA: Insufficient documentation

## 2024-04-30 ENCOUNTER — Encounter (HOSPITAL_BASED_OUTPATIENT_CLINIC_OR_DEPARTMENT_OTHER): Admitting: General Surgery

## 2024-04-30 DIAGNOSIS — E11621 Type 2 diabetes mellitus with foot ulcer: Secondary | ICD-10-CM | POA: Diagnosis not present

## 2024-05-07 ENCOUNTER — Encounter (HOSPITAL_BASED_OUTPATIENT_CLINIC_OR_DEPARTMENT_OTHER): Admitting: General Surgery

## 2024-05-13 ENCOUNTER — Encounter (HOSPITAL_BASED_OUTPATIENT_CLINIC_OR_DEPARTMENT_OTHER): Admitting: General Surgery

## 2024-05-13 DIAGNOSIS — E11621 Type 2 diabetes mellitus with foot ulcer: Secondary | ICD-10-CM | POA: Diagnosis not present

## 2024-05-19 ENCOUNTER — Encounter (HOSPITAL_BASED_OUTPATIENT_CLINIC_OR_DEPARTMENT_OTHER): Attending: General Surgery | Admitting: General Surgery

## 2024-05-19 DIAGNOSIS — M86171 Other acute osteomyelitis, right ankle and foot: Secondary | ICD-10-CM | POA: Insufficient documentation

## 2024-05-19 DIAGNOSIS — L03031 Cellulitis of right toe: Secondary | ICD-10-CM | POA: Insufficient documentation

## 2024-05-19 DIAGNOSIS — L97522 Non-pressure chronic ulcer of other part of left foot with fat layer exposed: Secondary | ICD-10-CM | POA: Insufficient documentation

## 2024-05-19 DIAGNOSIS — E11621 Type 2 diabetes mellitus with foot ulcer: Secondary | ICD-10-CM | POA: Insufficient documentation

## 2024-06-10 ENCOUNTER — Encounter (HOSPITAL_BASED_OUTPATIENT_CLINIC_OR_DEPARTMENT_OTHER): Admitting: General Surgery

## 2024-06-10 DIAGNOSIS — E11621 Type 2 diabetes mellitus with foot ulcer: Secondary | ICD-10-CM | POA: Diagnosis not present

## 2024-06-10 DIAGNOSIS — M86171 Other acute osteomyelitis, right ankle and foot: Secondary | ICD-10-CM | POA: Diagnosis not present

## 2024-06-10 DIAGNOSIS — L03031 Cellulitis of right toe: Secondary | ICD-10-CM | POA: Diagnosis not present

## 2024-06-10 DIAGNOSIS — L97522 Non-pressure chronic ulcer of other part of left foot with fat layer exposed: Secondary | ICD-10-CM | POA: Diagnosis present

## 2024-06-23 ENCOUNTER — Encounter (HOSPITAL_BASED_OUTPATIENT_CLINIC_OR_DEPARTMENT_OTHER): Admitting: General Surgery

## 2024-06-27 ENCOUNTER — Other Ambulatory Visit (HOSPITAL_COMMUNITY): Payer: Self-pay | Admitting: General Surgery

## 2024-06-27 ENCOUNTER — Ambulatory Visit (HOSPITAL_COMMUNITY)
Admission: RE | Admit: 2024-06-27 | Discharge: 2024-06-27 | Disposition: A | Source: Ambulatory Visit | Attending: General Surgery | Admitting: General Surgery

## 2024-06-27 DIAGNOSIS — E1169 Type 2 diabetes mellitus with other specified complication: Secondary | ICD-10-CM | POA: Diagnosis present

## 2024-06-27 DIAGNOSIS — L97509 Non-pressure chronic ulcer of other part of unspecified foot with unspecified severity: Secondary | ICD-10-CM | POA: Diagnosis present

## 2024-06-27 DIAGNOSIS — E11621 Type 2 diabetes mellitus with foot ulcer: Secondary | ICD-10-CM

## 2024-06-27 DIAGNOSIS — M869 Osteomyelitis, unspecified: Secondary | ICD-10-CM | POA: Diagnosis present

## 2024-06-30 ENCOUNTER — Encounter (HOSPITAL_BASED_OUTPATIENT_CLINIC_OR_DEPARTMENT_OTHER): Attending: General Surgery | Admitting: General Surgery

## 2024-06-30 DIAGNOSIS — M86171 Other acute osteomyelitis, right ankle and foot: Secondary | ICD-10-CM | POA: Insufficient documentation

## 2024-06-30 DIAGNOSIS — E11621 Type 2 diabetes mellitus with foot ulcer: Secondary | ICD-10-CM | POA: Diagnosis present

## 2024-06-30 DIAGNOSIS — L97522 Non-pressure chronic ulcer of other part of left foot with fat layer exposed: Secondary | ICD-10-CM | POA: Insufficient documentation

## 2024-06-30 DIAGNOSIS — L03031 Cellulitis of right toe: Secondary | ICD-10-CM | POA: Diagnosis not present

## 2024-07-03 ENCOUNTER — Ambulatory Visit: Admitting: Podiatry

## 2024-07-07 ENCOUNTER — Encounter (HOSPITAL_BASED_OUTPATIENT_CLINIC_OR_DEPARTMENT_OTHER): Admitting: General Surgery
